# Patient Record
Sex: Female | Born: 1960 | Race: White | Hispanic: No | Marital: Married | Smoking: Current every day smoker
Health system: Southern US, Community
[De-identification: ages and names within clinical notes are randomized; demographics above are authoritative.]

## PROBLEM LIST (undated history)

## (undated) DIAGNOSIS — I471 Supraventricular tachycardia, unspecified: Secondary | ICD-10-CM

## (undated) DIAGNOSIS — R000 Tachycardia, unspecified: Secondary | ICD-10-CM

## (undated) DIAGNOSIS — I519 Heart disease, unspecified: Secondary | ICD-10-CM

## (undated) DIAGNOSIS — F32A Depression, unspecified: Secondary | ICD-10-CM

## (undated) DIAGNOSIS — I10 Essential (primary) hypertension: Secondary | ICD-10-CM

## (undated) DIAGNOSIS — F329 Major depressive disorder, single episode, unspecified: Secondary | ICD-10-CM

## (undated) DIAGNOSIS — F419 Anxiety disorder, unspecified: Secondary | ICD-10-CM

## (undated) DIAGNOSIS — N393 Stress incontinence (female) (male): Secondary | ICD-10-CM

## (undated) DIAGNOSIS — C569 Malignant neoplasm of unspecified ovary: Secondary | ICD-10-CM

## (undated) HISTORY — DX: Heart disease, unspecified: I51.9

## (undated) HISTORY — DX: Supraventricular tachycardia: I47.1

## (undated) HISTORY — DX: Supraventricular tachycardia, unspecified: I47.10

## (undated) HISTORY — DX: Stress incontinence (female) (male): N39.3

## (undated) HISTORY — DX: Essential (primary) hypertension: I10

## (undated) HISTORY — PX: MOUTH SURGERY: SHX715

## (undated) HISTORY — PX: FOOT SURGERY: SHX648

## (undated) HISTORY — PX: OVARY SURGERY: SHX727

## (undated) HISTORY — PX: OTHER SURGICAL HISTORY: SHX169

## (undated) HISTORY — PX: CHOLECYSTECTOMY: SHX55

## (undated) HISTORY — PX: ABDOMINAL HYSTERECTOMY: SHX81

---

## 2008-10-15 ENCOUNTER — Emergency Department (HOSPITAL_COMMUNITY): Admission: EM | Admit: 2008-10-15 | Discharge: 2008-10-15 | Payer: Self-pay | Admitting: Emergency Medicine

## 2009-06-18 ENCOUNTER — Emergency Department (HOSPITAL_COMMUNITY): Admission: EM | Admit: 2009-06-18 | Discharge: 2009-06-18 | Payer: Self-pay | Admitting: Emergency Medicine

## 2009-06-20 ENCOUNTER — Emergency Department (HOSPITAL_COMMUNITY): Admission: EM | Admit: 2009-06-20 | Discharge: 2009-06-20 | Payer: Self-pay | Admitting: Emergency Medicine

## 2009-10-25 ENCOUNTER — Emergency Department (HOSPITAL_COMMUNITY): Admission: EM | Admit: 2009-10-25 | Discharge: 2009-10-25 | Payer: Self-pay | Admitting: Emergency Medicine

## 2009-11-03 ENCOUNTER — Encounter: Admission: RE | Admit: 2009-11-03 | Discharge: 2009-11-03 | Payer: Self-pay | Admitting: Gastroenterology

## 2009-12-08 ENCOUNTER — Emergency Department (HOSPITAL_BASED_OUTPATIENT_CLINIC_OR_DEPARTMENT_OTHER): Admission: EM | Admit: 2009-12-08 | Discharge: 2009-12-08 | Payer: Self-pay | Admitting: Emergency Medicine

## 2009-12-08 ENCOUNTER — Ambulatory Visit: Payer: Self-pay | Admitting: Diagnostic Radiology

## 2009-12-30 ENCOUNTER — Encounter: Admission: RE | Admit: 2009-12-30 | Discharge: 2009-12-30 | Payer: Self-pay | Admitting: Gastroenterology

## 2010-01-20 ENCOUNTER — Ambulatory Visit (HOSPITAL_COMMUNITY): Admission: RE | Admit: 2010-01-20 | Discharge: 2010-01-20 | Payer: Self-pay | Admitting: Family Medicine

## 2010-02-03 ENCOUNTER — Emergency Department (HOSPITAL_COMMUNITY): Admission: EM | Admit: 2010-02-03 | Discharge: 2010-02-03 | Payer: Self-pay | Admitting: Emergency Medicine

## 2010-02-18 ENCOUNTER — Ambulatory Visit (HOSPITAL_COMMUNITY)
Admission: RE | Admit: 2010-02-18 | Discharge: 2010-02-18 | Payer: Self-pay | Source: Home / Self Care | Admitting: General Surgery

## 2010-06-02 ENCOUNTER — Emergency Department (HOSPITAL_BASED_OUTPATIENT_CLINIC_OR_DEPARTMENT_OTHER)
Admission: EM | Admit: 2010-06-02 | Discharge: 2010-06-02 | Payer: Self-pay | Source: Home / Self Care | Admitting: Emergency Medicine

## 2010-08-15 LAB — GLUCOSE, CAPILLARY: Glucose-Capillary: 143 mg/dL — ABNORMAL HIGH (ref 70–99)

## 2010-08-15 LAB — BASIC METABOLIC PANEL
BUN: 8 mg/dL (ref 6–23)
Chloride: 105 mEq/L (ref 96–112)
Creatinine, Ser: 0.8 mg/dL (ref 0.4–1.2)
GFR calc Af Amer: >60 mL/min (ref >60)
GFR calc non Af Amer: >60 mL/min (ref >60)
Glucose, Bld: 127 mg/dL — ABNORMAL HIGH (ref 70–99)

## 2010-08-15 LAB — CBC
HCT: 37.3 % (ref 36.0–46.0)
Platelets: 154 10*3/uL (ref 150–400)
RDW: 12.6 % (ref 11.5–15.5)
WBC: 5.2 10*3/uL (ref 4.0–10.5)

## 2010-08-15 LAB — URINALYSIS, ROUTINE W REFLEX MICROSCOPIC
Glucose, UA: NEGATIVE mg/dL
Hgb urine dipstick: NEGATIVE
Protein, ur: NEGATIVE mg/dL

## 2010-08-15 LAB — DIFFERENTIAL
Eosinophils Relative: 1 % (ref 0–5)
Lymphocytes Relative: 14 % (ref 12–46)
Lymphs Abs: 0.7 10*3/uL (ref 0.7–4.0)
Monocytes Absolute: 0.4 10*3/uL (ref 0.1–1.0)

## 2010-08-18 LAB — COMPREHENSIVE METABOLIC PANEL
ALT: 20 U/L (ref 0–35)
ALT: 29 U/L (ref 0–35)
AST: 20 U/L (ref 0–37)
AST: 25 U/L (ref 0–37)
Albumin: 4.4 g/dL (ref 3.5–5.2)
CO2: 28 mEq/L (ref 19–32)
Calcium: 9.2 mg/dL (ref 8.4–10.5)
Chloride: 105 mEq/L (ref 96–112)
Creatinine, Ser: 0.82 mg/dL (ref 0.4–1.2)
Glucose, Bld: 63 mg/dL — ABNORMAL LOW (ref 70–99)
Potassium: 3.9 mEq/L (ref 3.5–5.1)
Sodium: 140 mEq/L (ref 135–145)
Sodium: 141 mEq/L (ref 135–145)
Total Bilirubin: 0.7 mg/dL (ref 0.3–1.2)
Total Protein: 6.3 g/dL (ref 6.0–8.3)

## 2010-08-18 LAB — URINALYSIS, ROUTINE W REFLEX MICROSCOPIC
Glucose, UA: NEGATIVE mg/dL
Ketones, ur: NEGATIVE mg/dL
Leukocytes, UA: NEGATIVE
Nitrite: NEGATIVE
Protein, ur: NEGATIVE mg/dL
Urobilinogen, UA: 0.2 mg/dL (ref 0.0–1.0)

## 2010-08-18 LAB — CBC
HCT: 38.7 % (ref 36.0–46.0)
Hemoglobin: 14.2 g/dL (ref 12.0–15.0)
MCH: 29.3 pg (ref 26.0–34.0)
MCV: 84.3 fL (ref 78.0–100.0)
Platelets: 136 10*3/uL — ABNORMAL LOW (ref 150–400)
Platelets: 160 10*3/uL (ref 150–400)
RBC: 4.59 MIL/uL (ref 3.87–5.11)
RBC: 4.82 MIL/uL (ref 3.87–5.11)
RDW: 13.2 % (ref 11.5–15.5)
RDW: 13.3 % (ref 11.5–15.5)
WBC: 4.7 10*3/uL (ref 4.0–10.5)

## 2010-08-18 LAB — DIFFERENTIAL
Basophils Absolute: 0 10*3/uL (ref 0.0–0.1)
Basophils Relative: 1 % (ref 0–1)
Basophils Relative: 1 % (ref 0–1)
Eosinophils Absolute: 0 10*3/uL (ref 0.0–0.7)
Eosinophils Relative: 2 % (ref 0–5)
Lymphocytes Relative: 22 % (ref 12–46)
Monocytes Absolute: 0.4 10*3/uL (ref 0.1–1.0)
Monocytes Relative: 8 % (ref 3–12)
Neutro Abs: 3.2 10*3/uL (ref 1.7–7.7)
Neutro Abs: 3.6 10*3/uL (ref 1.7–7.7)

## 2010-08-18 LAB — APTT: aPTT: 30 seconds (ref 24–37)

## 2010-08-21 LAB — DIFFERENTIAL
Basophils Absolute: 0 10*3/uL (ref 0.0–0.1)
Basophils Absolute: 0 10*3/uL (ref 0.0–0.1)
Basophils Relative: 1 % (ref 0–1)
Basophils Relative: 0 % (ref 0–1)
Eosinophils Absolute: 0 10*3/uL (ref 0.0–0.7)
Eosinophils Absolute: 0.1 10*3/uL (ref 0.0–0.7)
Eosinophils Absolute: 0 10*3/uL (ref 0.0–0.7)
Lymphocytes Relative: 26 % (ref 12–46)
Lymphs Abs: 1.6 10*3/uL (ref 0.7–4.0)
Monocytes Absolute: 0.3 10*3/uL (ref 0.1–1.0)
Monocytes Relative: 6 % (ref 3–12)
Monocytes Relative: 9 % (ref 3–12)
Monocytes Relative: 7 % (ref 3–12)
Neutro Abs: 3.5 10*3/uL (ref 1.7–7.7)
Neutrophils Relative %: 63 % (ref 43–77)
Neutrophils Relative %: 72 % (ref 43–77)
Neutrophils Relative %: 77 % (ref 43–77)

## 2010-08-21 LAB — COMPREHENSIVE METABOLIC PANEL
ALT: 56 U/L — ABNORMAL HIGH (ref 0–35)
ALT: 11 U/L (ref 0–35)
Alkaline Phosphatase: 88 U/L (ref 39–117)
Alkaline Phosphatase: 78 U/L (ref 39–117)
BUN: 10 mg/dL (ref 6–23)
BUN: 10 mg/dL (ref 6–23)
CO2: 28 mEq/L (ref 19–32)
Chloride: 105 mEq/L (ref 96–112)
Chloride: 106 mEq/L (ref 96–112)
GFR calc non Af Amer: 59 mL/min — ABNORMAL LOW (ref >60)
Glucose, Bld: 112 mg/dL — ABNORMAL HIGH (ref 70–99)
Glucose, Bld: 103 mg/dL — ABNORMAL HIGH (ref 70–99)
Potassium: 3.8 mEq/L (ref 3.5–5.1)
Potassium: 3.2 mEq/L — ABNORMAL LOW (ref 3.5–5.1)
Sodium: 142 mEq/L (ref 135–145)
Sodium: 142 mEq/L (ref 135–145)
Total Bilirubin: 0.8 mg/dL (ref 0.3–1.2)
Total Bilirubin: 0.4 mg/dL (ref 0.3–1.2)
Total Protein: 7.2 g/dL (ref 6.0–8.3)

## 2010-08-21 LAB — POCT I-STAT, CHEM 8
BUN: 12 mg/dL (ref 6–23)
Chloride: 105 mEq/L (ref 96–112)
Creatinine, Ser: 0.9 mg/dL (ref 0.4–1.2)
Potassium: 3.5 mEq/L (ref 3.5–5.1)
Sodium: 139 mEq/L (ref 135–145)

## 2010-08-21 LAB — CK TOTAL AND CKMB (NOT AT ARMC): CK, MB: 2.9 ng/mL (ref 0.3–4.0)

## 2010-08-21 LAB — CBC
HCT: 40 % (ref 36.0–46.0)
HCT: 40.7 % (ref 36.0–46.0)
HCT: 39.2 % (ref 36.0–46.0)
Hemoglobin: 13.8 g/dL (ref 12.0–15.0)
Hemoglobin: 14 g/dL (ref 12.0–15.0)
Hemoglobin: 13.7 g/dL (ref 12.0–15.0)
MCV: 83.9 fL (ref 78.0–100.0)
MCV: 85.1 fL (ref 78.0–100.0)
Platelets: 187 10*3/uL (ref 150–400)
RBC: 4.85 MIL/uL (ref 3.87–5.11)
RBC: 4.56 MIL/uL (ref 3.87–5.11)
RDW: 12.6 % (ref 11.5–15.5)
WBC: 4.8 10*3/uL (ref 4.0–10.5)
WBC: 6.4 10*3/uL (ref 4.0–10.5)

## 2010-08-21 LAB — URINALYSIS, ROUTINE W REFLEX MICROSCOPIC
Bilirubin Urine: NEGATIVE
Glucose, UA: NEGATIVE mg/dL
Ketones, ur: NEGATIVE mg/dL
Protein, ur: NEGATIVE mg/dL
pH: 7.5 (ref 5.0–8.0)

## 2010-08-21 LAB — TSH: TSH: 1.798 u[IU]/mL (ref 0.350–4.500)

## 2010-08-21 LAB — TROPONIN I
Troponin I: 0.05 ng/mL (ref 0.00–0.06)
Troponin I: 0.07 ng/mL — ABNORMAL HIGH (ref 0.00–0.06)

## 2010-08-21 LAB — HEMOCCULT GUIAC POC 1CARD (OFFICE): Fecal Occult Bld: NEGATIVE

## 2010-08-21 LAB — LIPASE, BLOOD: Lipase: 29 U/L (ref 23–300)

## 2010-08-21 LAB — URINE MICROSCOPIC-ADD ON

## 2010-08-22 LAB — CBC
HCT: 42.3 % (ref 36.0–46.0)
Platelets: 170 10*3/uL (ref 150–400)
RDW: 13.3 % (ref 11.5–15.5)

## 2010-08-22 LAB — URINALYSIS, ROUTINE W REFLEX MICROSCOPIC
Glucose, UA: NEGATIVE mg/dL
Specific Gravity, Urine: 1.012 (ref 1.005–1.030)

## 2010-08-22 LAB — COMPREHENSIVE METABOLIC PANEL
Albumin: 4.4 g/dL (ref 3.5–5.2)
Alkaline Phosphatase: 87 U/L (ref 39–117)
BUN: 7 mg/dL (ref 6–23)
Creatinine, Ser: 0.71 mg/dL (ref 0.4–1.2)
GFR calc Af Amer: >60 mL/min (ref >60)
Potassium: 3.4 mEq/L — ABNORMAL LOW (ref 3.5–5.1)
Total Protein: 6.9 g/dL (ref 6.0–8.3)

## 2010-08-22 LAB — POCT CARDIAC MARKERS

## 2010-08-22 LAB — URINE MICROSCOPIC-ADD ON

## 2010-08-22 LAB — DIFFERENTIAL
Lymphocytes Relative: 14 % (ref 12–46)
Monocytes Absolute: 0.3 10*3/uL (ref 0.1–1.0)
Monocytes Relative: 5 % (ref 3–12)
Neutro Abs: 4.2 10*3/uL (ref 1.7–7.7)

## 2010-09-13 LAB — DIFFERENTIAL
Eosinophils Absolute: 0.1 10*3/uL (ref 0.0–0.7)
Eosinophils Relative: 2 % (ref 0–5)
Lymphocytes Relative: 27 % (ref 12–46)
Lymphs Abs: 1.1 10*3/uL (ref 0.7–4.0)
Monocytes Absolute: 0.3 10*3/uL (ref 0.1–1.0)
Monocytes Relative: 8 % (ref 3–12)

## 2010-09-13 LAB — BASIC METABOLIC PANEL
Chloride: 106 mEq/L (ref 96–112)
GFR calc non Af Amer: >60 mL/min (ref >60)
Potassium: 4 mEq/L (ref 3.5–5.1)
Sodium: 140 mEq/L (ref 135–145)

## 2010-09-13 LAB — POCT CARDIAC MARKERS: Troponin i, poc: <0.05 ng/mL (ref 0.00–0.09)

## 2010-09-13 LAB — CBC
HCT: 39.7 % (ref 36.0–46.0)
Hemoglobin: 14 g/dL (ref 12.0–15.0)
MCV: 83.8 fL (ref 78.0–100.0)
RBC: 4.74 MIL/uL (ref 3.87–5.11)
WBC: 4 10*3/uL (ref 4.0–10.5)

## 2011-03-17 ENCOUNTER — Other Ambulatory Visit: Payer: Self-pay

## 2011-03-17 ENCOUNTER — Encounter: Payer: Self-pay | Admitting: *Deleted

## 2011-03-17 ENCOUNTER — Emergency Department (INDEPENDENT_AMBULATORY_CARE_PROVIDER_SITE_OTHER): Payer: Self-pay

## 2011-03-17 ENCOUNTER — Emergency Department (HOSPITAL_BASED_OUTPATIENT_CLINIC_OR_DEPARTMENT_OTHER)
Admission: EM | Admit: 2011-03-17 | Discharge: 2011-03-17 | Disposition: A | Discharge status: Home / Self Care | Payer: Self-pay | Attending: Emergency Medicine | Admitting: Emergency Medicine

## 2011-03-17 DIAGNOSIS — F17200 Nicotine dependence, unspecified, uncomplicated: Secondary | ICD-10-CM | POA: Insufficient documentation

## 2011-03-17 DIAGNOSIS — R002 Palpitations: Secondary | ICD-10-CM | POA: Insufficient documentation

## 2011-03-17 DIAGNOSIS — Z87891 Personal history of nicotine dependence: Secondary | ICD-10-CM

## 2011-03-17 DIAGNOSIS — R42 Dizziness and giddiness: Secondary | ICD-10-CM | POA: Insufficient documentation

## 2011-03-17 HISTORY — DX: Anxiety disorder, unspecified: F41.9

## 2011-03-17 HISTORY — DX: Tachycardia, unspecified: R00.0

## 2011-03-17 HISTORY — DX: Malignant neoplasm of unspecified ovary: C56.9

## 2011-03-17 HISTORY — DX: Major depressive disorder, single episode, unspecified: F32.9

## 2011-03-17 HISTORY — DX: Depression, unspecified: F32.A

## 2011-03-17 LAB — CBC
HCT: 38.6 % (ref 36.0–46.0)
Hemoglobin: 13.6 g/dL (ref 12.0–15.0)
MCH: 28.6 pg (ref 26.0–34.0)
MCHC: 35.2 g/dL (ref 30.0–36.0)
MCV: 81.3 fL (ref 78.0–100.0)
Platelets: 147 10*3/uL — ABNORMAL LOW (ref 150–400)
RBC: 4.75 MIL/uL (ref 3.87–5.11)
RDW: 12.4 % (ref 11.5–15.5)
WBC: 4.9 10*3/uL (ref 4.0–10.5)

## 2011-03-17 LAB — BASIC METABOLIC PANEL
BUN: 12 mg/dL (ref 6–23)
CO2: 27 mEq/L (ref 19–32)
Calcium: 9.3 mg/dL (ref 8.4–10.5)
Chloride: 102 mEq/L (ref 96–112)
Creatinine, Ser: 0.8 mg/dL (ref 0.50–1.10)
GFR calc Af Amer: >90 mL/min (ref >90)
GFR calc non Af Amer: 85 mL/min — ABNORMAL LOW (ref >90)
Glucose, Bld: 128 mg/dL — ABNORMAL HIGH (ref 70–99)
Potassium: 3.7 mEq/L (ref 3.5–5.1)
Sodium: 138 mEq/L (ref 135–145)

## 2011-03-17 LAB — CARDIAC PANEL(CRET KIN+CKTOT+MB+TROPI)
CK, MB: 1.5 ng/mL (ref 0.3–4.0)
Relative Index: INVALID (ref 0.0–2.5)
Total CK: 42 U/L (ref 7–177)
Troponin I: <0.3 ng/mL (ref <0.30)

## 2011-03-17 LAB — TSH: TSH: 0.991 u[IU]/mL (ref 0.350–4.500)

## 2011-03-17 NOTE — ED Notes (Signed)
Patient states she has a two week history of light headedness associated with rapid heart rate and diaphoresis. Was seen by her PCP and started on Propranolol 2 x day as needed.  States her symptoms improved until 2 days ago when she developed URI and started taking OTC Coricidin.   This morning patient states she was awakened with a pounding headache.  States her bp was elevated and her heart was racing.  Took   Her propranolol with minimal relief.  Denies any rapid heart rate at this time.

## 2011-03-17 NOTE — ED Provider Notes (Signed)
History   50yf with palpitations. Onset a little over 2w ago. Feels like heart racing and sensation of dizziness/lightheadedness when this happens. Denies cp or sob, but sometimes feels sweaty. Saw pcp for same and recently srtarted on propanolol prn for same. Today pt woke up with ha and felt like heart racing again. Has been taking coricidin for uri symtpoms for past couple days. Currently no complaints. No fever or chills. No n/v. Denies hx of blood clot. No unusual swelling or leg pain. Denies hx of thyroid problems. deneis drug use.  CSN: [id number] Arrival date & time: 03/17/2011 11:43 AM  Chief Complaint  Patient presents with  . Dizziness    rapid heart    (Consider location/radiation/quality/duration/timing/severity/associated sxs/prior treatment) HPI  Past Medical History  Diagnosis Date  . Ovarian cancer   . Anxiety   . Rapid heart rate   . Migraine   . Depression     Past Surgical History  Procedure Date  . Ovary surgery   . Cholecystectomy   . Abdominal hysterectomy   . Mouth surgery     stone  . Bladder tack   . Foot surgery     No family history on file.  History  Substance Use Topics  . Smoking status: Current Some Day Smoker -- 1.0 packs/day for 5 years    Types: Cigarettes  . Smokeless tobacco: Not on file  . Alcohol Use: No    OB History    Grav Para Term Preterm Abortions TAB SAB Ect Mult Living                  Review of Systems   Review of symptoms negative unless otherwise noted in HPI.   Allergies  Iodine  Home Medications   Current Outpatient Rx  Name Route Sig Dispense Refill  . ASPIRIN 81 MG PO TABS Oral Take 81 mg by mouth daily.      . CHLORPHEN-PSEUDOEPHED-APAP 2-30-325 MG PO TABS Oral Take by mouth.      Marland Kitchen DIAZEPAM 5 MG PO TABS Oral Take 5 mg by mouth every 8 (eight) hours as needed.      . OMEGA-3 FATTY ACIDS 1000 MG PO CAPS Oral Take 2 g by mouth daily.      Marland Kitchen MIRTAZAPINE 30 MG PO TABS Oral Take 30 mg by mouth at  bedtime.      Marland Kitchen PRAVASTATIN SODIUM 20 MG PO TABS Oral Take by mouth daily.      Marland Kitchen PROPRANOLOL HCL 10 MG PO TABS Oral Take 10 mg by mouth 2 (two) times daily.      Marland Kitchen ZOLPIDEM TARTRATE 10 MG PO TABS Oral Take 10 mg by mouth at bedtime as needed.        BP 124/92  Pulse 91  Resp 22  SpO2 98%  Physical Exam  Nursing note and vitals reviewed. Constitutional: She appears well-developed and well-nourished.  HENT:  Head: Normocephalic and atraumatic.  Eyes: Pupils are equal, round, and reactive to light. Right eye exhibits no discharge. Left eye exhibits no discharge.  Neck: Neck supple.  Cardiovascular: Normal rate, regular rhythm and normal heart sounds.   Pulmonary/Chest: Effort normal and breath sounds normal. No respiratory distress.  Abdominal: Soft. She exhibits no distension. There is no tenderness.  Neurological: She is alert.  Skin: Skin is warm and dry.  Psychiatric: She has a normal mood and affect. Her behavior is normal. Thought content normal.    ED Course  Procedures (including critical care  time)  Labs Reviewed  BASIC METABOLIC PANEL - Abnormal; Notable for the following:    Glucose, Bld 128 (*)    GFR calc non Af Amer 85 (*)    All other components within normal limits  CBC - Abnormal; Notable for the following:    Platelets 147 (*)    All other components within normal limits  CARDIAC PANEL(CRET KIN+CKTOT+MB+TROPI)  TSH   Dg Chest 2 View  03/17/2011  *RADIOLOGY REPORT*  Clinical Data: Heart palpitations, smoking history  CHEST - 2 VIEW  Comparison: Chest x-ray of 05/23/2010  Findings: The lungs are clear and slightly hyperaerated.  Mild peribronchial thickening is noted.  Mediastinal contours are stable.  The heart is within normal limits in size.  No bony abnormality is seen  IMPRESSION: Hyperaeration.  Peribronchial thickening which may indicate bronchitis. No pneumonia.  Original Report Authenticated By: Juline Patch, M.D.   EKG:  Rhythm: normal  sinus Rate: 92 Intervals:normal ST segments: NS ST changes   No diagnosis found.    MDM  13YQ with palpitations. Sinus on ekg and on monitor in ED. W/u unremarkable. May be related to coricidan use?  Pt with hx of recurrent palpitations without clear etiology. Consider dysrhythmia and recommended that discuss with pcp possible monitoring. HD stable. Low clinical suspicion for PE. Not anemic. May have anxiety component. Pt is HD stable and feel appropriate for DC at this time.        Raeford Razor, MD 03/21/11 [phone]

## 2011-08-11 ENCOUNTER — Encounter (HOSPITAL_BASED_OUTPATIENT_CLINIC_OR_DEPARTMENT_OTHER): Payer: Self-pay | Admitting: *Deleted

## 2011-08-11 ENCOUNTER — Other Ambulatory Visit: Payer: Self-pay

## 2011-08-11 ENCOUNTER — Emergency Department (HOSPITAL_BASED_OUTPATIENT_CLINIC_OR_DEPARTMENT_OTHER)
Admission: EM | Admit: 2011-08-11 | Discharge: 2011-08-11 | Disposition: A | Discharge status: Home / Self Care | Payer: Self-pay | Attending: Emergency Medicine | Admitting: Emergency Medicine

## 2011-08-11 ENCOUNTER — Emergency Department (INDEPENDENT_AMBULATORY_CARE_PROVIDER_SITE_OTHER): Payer: Self-pay

## 2011-08-11 DIAGNOSIS — R079 Chest pain, unspecified: Secondary | ICD-10-CM | POA: Insufficient documentation

## 2011-08-11 DIAGNOSIS — R61 Generalized hyperhidrosis: Secondary | ICD-10-CM | POA: Insufficient documentation

## 2011-08-11 DIAGNOSIS — M25519 Pain in unspecified shoulder: Secondary | ICD-10-CM | POA: Insufficient documentation

## 2011-08-11 DIAGNOSIS — R110 Nausea: Secondary | ICD-10-CM | POA: Insufficient documentation

## 2011-08-11 DIAGNOSIS — E785 Hyperlipidemia, unspecified: Secondary | ICD-10-CM | POA: Insufficient documentation

## 2011-08-11 DIAGNOSIS — R0602 Shortness of breath: Secondary | ICD-10-CM

## 2011-08-11 DIAGNOSIS — F411 Generalized anxiety disorder: Secondary | ICD-10-CM | POA: Insufficient documentation

## 2011-08-11 DIAGNOSIS — F3289 Other specified depressive episodes: Secondary | ICD-10-CM | POA: Insufficient documentation

## 2011-08-11 DIAGNOSIS — R0989 Other specified symptoms and signs involving the circulatory and respiratory systems: Secondary | ICD-10-CM | POA: Insufficient documentation

## 2011-08-11 DIAGNOSIS — R0609 Other forms of dyspnea: Secondary | ICD-10-CM | POA: Insufficient documentation

## 2011-08-11 DIAGNOSIS — F329 Major depressive disorder, single episode, unspecified: Secondary | ICD-10-CM | POA: Insufficient documentation

## 2011-08-11 DIAGNOSIS — Z7982 Long term (current) use of aspirin: Secondary | ICD-10-CM | POA: Insufficient documentation

## 2011-08-11 DIAGNOSIS — K219 Gastro-esophageal reflux disease without esophagitis: Secondary | ICD-10-CM | POA: Insufficient documentation

## 2011-08-11 DIAGNOSIS — F17200 Nicotine dependence, unspecified, uncomplicated: Secondary | ICD-10-CM | POA: Insufficient documentation

## 2011-08-11 DIAGNOSIS — Z79899 Other long term (current) drug therapy: Secondary | ICD-10-CM | POA: Insufficient documentation

## 2011-08-11 DIAGNOSIS — Z8543 Personal history of malignant neoplasm of ovary: Secondary | ICD-10-CM | POA: Insufficient documentation

## 2011-08-11 DIAGNOSIS — Z9889 Other specified postprocedural states: Secondary | ICD-10-CM | POA: Insufficient documentation

## 2011-08-11 LAB — CBC
HCT: 37.2 % (ref 36.0–46.0)
Hemoglobin: 13 g/dL (ref 12.0–15.0)
MCV: 81 fL (ref 78.0–100.0)
RDW: 13.1 % (ref 11.5–15.5)
WBC: 5.1 10*3/uL (ref 4.0–10.5)

## 2011-08-11 LAB — COMPREHENSIVE METABOLIC PANEL
BUN: 8 mg/dL (ref 6–23)
CO2: 26 mEq/L (ref 19–32)
Calcium: 9.4 mg/dL (ref 8.4–10.5)
Chloride: 101 mEq/L (ref 96–112)
Creatinine, Ser: 0.8 mg/dL (ref 0.50–1.10)
GFR calc Af Amer: >90 mL/min (ref >90)
GFR calc non Af Amer: 85 mL/min — ABNORMAL LOW (ref >90)
Glucose, Bld: 112 mg/dL — ABNORMAL HIGH (ref 70–99)
Total Bilirubin: 0.2 mg/dL — ABNORMAL LOW (ref 0.3–1.2)

## 2011-08-11 LAB — DIFFERENTIAL
Basophils Absolute: 0 10*3/uL (ref 0.0–0.1)
Eosinophils Relative: 4 % (ref 0–5)
Lymphocytes Relative: 28 % (ref 12–46)
Lymphs Abs: 1.5 10*3/uL (ref 0.7–4.0)
Monocytes Absolute: 0.5 10*3/uL (ref 0.1–1.0)
Monocytes Relative: 9 % (ref 3–12)
Neutro Abs: 3 10*3/uL (ref 1.7–7.7)

## 2011-08-11 LAB — LIPASE, BLOOD: Lipase: 15 U/L (ref 11–59)

## 2011-08-11 LAB — TROPONIN I: Troponin I: <0.3 ng/mL (ref <0.30)

## 2011-08-11 MED ORDER — GI COCKTAIL ~~LOC~~
30.0000 mL | Freq: Once | ORAL | Status: AC
  Administered 2011-08-11: 30 mL via ORAL
  Filled 2011-08-11: qty 30

## 2011-08-11 MED ORDER — ASPIRIN 81 MG PO CHEW
CHEWABLE_TABLET | ORAL | Status: AC
  Filled 2011-08-11: qty 1

## 2011-08-11 MED ORDER — ASPIRIN 81 MG PO CHEW
324.0000 mg | CHEWABLE_TABLET | Freq: Once | ORAL | Status: AC
  Administered 2011-08-11: 324 mg via ORAL
  Filled 2011-08-11: qty 4

## 2011-08-11 MED ORDER — PANTOPRAZOLE SODIUM 40 MG IV SOLR
40.0000 mg | Freq: Once | INTRAVENOUS | Status: AC
  Administered 2011-08-11: 40 mg via INTRAVENOUS
  Filled 2011-08-11: qty 40

## 2011-08-11 MED ORDER — ESOMEPRAZOLE MAGNESIUM 40 MG PO CPDR: 40.0000 mg | DELAYED_RELEASE_CAPSULE | Freq: Every day | ORAL | Status: DC

## 2011-08-11 MED ORDER — SODIUM CHLORIDE 0.9 % IV SOLN
INTRAVENOUS | Status: DC
  Administered 2011-08-11: 18:00:00 via INTRAVENOUS

## 2011-08-11 NOTE — Discharge Instructions (Signed)
Gastroesophageal Reflux Disease, Adult Gastroesophageal reflux disease (GERD) happens when acid from your stomach flows up into the esophagus. When acid comes in contact with the esophagus, the acid causes soreness (inflammation) in the esophagus. Over time, GERD may create small holes (ulcers) in the lining of the esophagus. CAUSES   Increased body weight. This puts pressure on the stomach, making acid rise from the stomach into the esophagus.   Smoking. This increases acid production in the stomach.   Drinking alcohol. This causes decreased pressure in the lower esophageal sphincter (valve or ring of muscle between the esophagus and stomach), allowing acid from the stomach into the esophagus.   Late evening meals and a full stomach. This increases pressure and acid production in the stomach.   A malformed lower esophageal sphincter.  Sometimes, no cause is found. SYMPTOMS   Burning pain in the lower part of the mid-chest behind the breastbone and in the mid-stomach area. This may occur twice a week or more often.   Trouble swallowing.   Sore throat.   Dry cough.   Asthma-like symptoms including chest tightness, shortness of breath, or wheezing.  DIAGNOSIS  Your caregiver may be able to diagnose GERD based on your symptoms. In some cases, X-rays and other tests may be done to check for complications or to check the condition of your stomach and esophagus. TREATMENT  Your caregiver may recommend over-the-counter or prescription medicines to help decrease acid production. Ask your caregiver before starting or adding any new medicines.  HOME CARE INSTRUCTIONS   Change the factors that you can control. Ask your caregiver for guidance concerning weight loss, quitting smoking, and alcohol consumption.   Avoid foods and drinks that make your symptoms worse, such as:   Caffeine or alcoholic drinks.   Chocolate.   Peppermint or mint flavorings.   Garlic and onions.   Spicy foods.     Citrus fruits, such as oranges, lemons, or limes.   Tomato-based foods such as sauce, chili, salsa, and pizza.   Fried and fatty foods.   Avoid lying down for the 3 hours prior to your bedtime or prior to taking a nap.   Eat small, frequent meals instead of large meals.   Wear loose-fitting clothing. Do not wear anything tight around your waist that causes pressure on your stomach.   Raise the head of your bed 6 to 8 inches with wood blocks to help you sleep. Extra pillows will not help.   Only take over-the-counter or prescription medicines for pain, discomfort, or fever as directed by your caregiver.   Do not take aspirin, ibuprofen, or other nonsteroidal anti-inflammatory drugs (NSAIDs).  SEEK IMMEDIATE MEDICAL CARE IF:   You have pain in your arms, neck, jaw, teeth, or back.   Your pain increases or changes in intensity or duration.   You develop nausea, vomiting, or sweating (diaphoresis).   You develop shortness of breath, or you faint.   Your vomit is green, yellow, black, or looks like coffee grounds or blood.   Your stool is red, bloody, or black.  These symptoms could be signs of other problems, such as heart disease, gastric bleeding, or esophageal bleeding. MAKE SURE YOU:   Understand these instructions.   Will watch your condition.   Will get help right away if you are not doing well or get worse.  Document Released: 03/01/2005 Document Revised: 05/11/2011 Document Reviewed: 12/09/2010 [facility] Patient Information 2012 [place], [place].  Esomeprazole capsules What is this medicine? ESOMEPRAZOLE (es  oh ME pray zol) prevents the production of acid in the stomach. It is used to treat gastroesophageal reflux disease (GERD), ulcers, certain bacteria in the stomach, and inflammation of the esophagus. It can also be used to prevent ulcers in patients taking medicines called NSAIDs. This medicine may be used for other purposes; ask your health care provider or  pharmacist if you have questions. What should I tell my health care provider before I take this medicine? They need to know if you have any of these conditions: -liver disease -low levels of magnesium in the blood -an unusual or allergic reaction to esomeprazole, other medicines, foods, dyes, or preservatives -pregnant or trying to get pregnant -breast-feeding How should I use this medicine? Take this medicine by mouth. Swallow the capsules whole with a drink of water. Follow the directions on the prescription label. Do not crush, break or chew. The capsules can be opened and the contents sprinkled on applesauce. Do not crush the contents into the food. This medicine works best if taken on an empty stomach at least one hour before a meal. Take your medicine at regular intervals. Do not take your medicine more often than directed. Talk to your pediatrician regarding the use of this medicine in children. Special care may be needed. Overdosage: If you think you have taken too much of this medicine contact a poison control center or emergency room at once. NOTE: This medicine is only for you. Do not share this medicine with others. What if I miss a dose? If you miss a dose, take it as soon as you can. If it is almost time for your next dose, take only that dose. Do not take double or extra doses. What may interact with this medicine? Do not take this medicine with any of the following medications: -atazanavir -nelfinavir This medicine may also interact with the following medications: -ampicillin -digoxin -diuretics -iron salts -itraconazole -ketoconazole -warfarin This list may not describe all possible interactions. Give your health care provider a list of all the medicines, herbs, non-prescription drugs, or dietary supplements you use. Also tell them if you smoke, drink alcohol, or use illegal drugs. Some items may interact with your medicine. What should I watch for while using this  medicine? It can take several days before your stomach pains get better. Check with your doctor or health care professional if your condition does not start to get better, or if it gets worse. You may need blood work done while you are taking this medicine. What side effects may I notice from receiving this medicine? Side effects that you should report to your doctor or health care professional as soon as possible: -allergic reactions like skin rash, itching or hives, swelling of the face, lips, or tongue -bone, muscle or joint pain -breathing problems -chest pain or chest tightness -dark yellow or brown urine -fast, irregular heartbeat -feeling faint or lightheaded -fever or sore throat -muscle spasms -tremors -unusual bleeding or bruising -unusually weak or tired -upset stomach -yellowing of the eyes or skin Side effects that usually do not require medical attention (Report these to your doctor or health care professional if they continue or are bothersome.): -constipation -diarrhea -dry mouth -headache -nausea -stomach pain or gas -vomiting This list may not describe all possible side effects. Call your doctor for medical advice about side effects. You may report side effects to FDA at 1-800-FDA-1088. Where should I keep my medicine? Keep out of the reach of children. Store at room temperature between  15 and 30 degrees C (59 and 86 degrees F). Protect from light and moisture. Throw away any unused medicine after the expiration date. NOTE: This sheet is a summary. It may not cover all possible information. If you have questions about this medicine, talk to your doctor, pharmacist, or health care provider.  2012, [employer]. (08/10/2009 10:44:28 AM)

## 2011-08-11 NOTE — ED Notes (Signed)
Pt to triage in w/c, reports chest pain x yesterday, intermittant, sharp in nature, with sob and nausea.

## 2011-08-11 NOTE — ED Provider Notes (Signed)
History     CSN: [id number]  Arrival date & time 08/11/11  1726   First MD Initiated Contact with Patient 08/11/11 1748      Chief Complaint  Patient presents with  . Chest Pain    (Consider location/radiation/quality/duration/timing/severity/associated sxs/prior treatment) Patient is a 51 y.o. female presenting with chest pain. The history is provided by the patient.  Chest Pain   Yesterday, she had a dull, achy pain in her right shoulder. Nothing made the pain better nothing made it worse. About one hour ago, she started having a sharp pain in the epigastric area which radiated around to the back and a little bit to the right chest. There is associated nausea, dyspnea and diaphoresis. She tried taking some Mylanta which to give partial relief. Pain is very similar to what she had prior to having her gallbladder taken out. Pain was better when she stood up, worse when she sat up. She did not notice anything else affecting the pain. Pain was 10 out of 10 at its worst, 6/10 currently. Cardiac risk factors that include tobacco use and hyperlipidemia. No history of hypertension, diabetes, and no family history of premature coronary atherosclerosis.  Past Medical History  Diagnosis Date  . Ovarian cancer   . Anxiety   . Rapid heart rate   . Migraine   . Depression     Past Surgical History  Procedure Date  . Ovary surgery   . Cholecystectomy   . Abdominal hysterectomy   . Mouth surgery     stone  . Bladder tack   . Foot surgery     History reviewed. No pertinent family history.  History  Substance Use Topics  . Smoking status: Current Some Day Smoker -- 1.0 packs/day for 5 years    Types: Cigarettes  . Smokeless tobacco: Not on file  . Alcohol Use: No    OB History    Grav Para Term Preterm Abortions TAB SAB Ect Mult Living                  Review of Systems  Cardiovascular: Positive for chest pain.  All other systems reviewed and are negative.    Allergies    Iodine  Home Medications   Current Outpatient Rx  Name Route Sig Dispense Refill  . ASPIRIN 81 MG PO TABS Oral Take 81 mg by mouth daily.      . CHLORPHEN-PSEUDOEPHED-APAP 2-30-325 MG PO TABS Oral Take by mouth.      Marland Kitchen DIAZEPAM 5 MG PO TABS Oral Take 5 mg by mouth every 8 (eight) hours as needed.      . OMEGA-3 FATTY ACIDS 1000 MG PO CAPS Oral Take 2 g by mouth daily.      Marland Kitchen MIRTAZAPINE 30 MG PO TABS Oral Take 30 mg by mouth at bedtime.      Marland Kitchen PRAVASTATIN SODIUM 20 MG PO TABS Oral Take by mouth daily.      Marland Kitchen PROPRANOLOL HCL 10 MG PO TABS Oral Take 10 mg by mouth 2 (two) times daily.      Marland Kitchen ZOLPIDEM TARTRATE 10 MG PO TABS Oral Take 10 mg by mouth at bedtime as needed.        BP 117/77  Pulse 95  Temp(Src) 98.2 F (36.8 C) (Oral)  Resp 20  Ht 5\' 2"  (1.575 m)  Wt 138 lb (62.596 kg)  BMI 25.24 kg/m2  SpO2 97%  Physical Exam  Nursing note and vitals reviewed.  51 year old female  is resting comfortably and in no acute distress. Vital signs are normal. Oxygen saturation is 97% which is normal. Head is normocephalic and atraumatic. PERRLA, EOMI. Oropharynx is clear. Neck is nontender and supple without adenopathy JVD. Lungs are clear without rales, wheezes, rhonchi. Heart has regular rate rhythm without murmur. There is no chest wall tenderness. Abdomen is soft, flat, nontender without masses or splenomegaly. There is mild epigastric tenderness. There is no right upper quadrant tenderness. There is no rebound or guarding. Extremities no cyanosis or edema, full range of motion is present. Skin is warm and dry without rash. Neurologic: Mental status is normal, cranial nerves are intact, there no focal motor or sensory deficits.  ED Course  Procedures (including critical care time)  Results for orders placed during the hospital encounter of 08/11/11  CBC      Component Value Range   WBC 5.1  4.0 - 10.5 (K/uL)   RBC 4.59  3.87 - 5.11 (MIL/uL)   Hemoglobin 13.0  12.0 - 15.0 (g/dL)   HCT  16.1  09.6 - 04.5 (%)   MCV 81.0  78.0 - 100.0 (fL)   MCH 28.3  26.0 - 34.0 (pg)   MCHC 34.9  30.0 - 36.0 (g/dL)   RDW 40.9  81.1 - 91.4 (%)   Platelets 178  150 - 400 (K/uL)  DIFFERENTIAL      Component Value Range   Neutrophils Relative 58  43 - 77 (%)   Neutro Abs 3.0  1.7 - 7.7 (K/uL)   Lymphocytes Relative 28  12 - 46 (%)   Lymphs Abs 1.5  0.7 - 4.0 (K/uL)   Monocytes Relative 9  3 - 12 (%)   Monocytes Absolute 0.5  0.1 - 1.0 (K/uL)   Eosinophils Relative 4  0 - 5 (%)   Eosinophils Absolute 0.2  0.0 - 0.7 (K/uL)   Basophils Relative 1  0 - 1 (%)   Basophils Absolute 0.0  0.0 - 0.1 (K/uL)  COMPREHENSIVE METABOLIC PANEL      Component Value Range   Sodium 137  135 - 145 (mEq/L)   Potassium 3.7  3.5 - 5.1 (mEq/L)   Chloride 101  96 - 112 (mEq/L)   CO2 26  19 - 32 (mEq/L)   Glucose, Bld 112 (*) 70 - 99 (mg/dL)   BUN 8  6 - 23 (mg/dL)   Creatinine, Ser 7.82  0.50 - 1.10 (mg/dL)   Calcium 9.4  8.4 - 95.6 (mg/dL)   Total Protein 6.9  6.0 - 8.3 (g/dL)   Albumin 4.1  3.5 - 5.2 (g/dL)   AST 16  0 - 37 (U/L)   ALT 17  0 - 35 (U/L)   Alkaline Phosphatase 90  39 - 117 (U/L)   Total Bilirubin 0.2 (*) 0.3 - 1.2 (mg/dL)   GFR calc non Af Amer 85 (*) >90 (mL/min)   GFR calc Af Amer >90  >90 (mL/min)  LIPASE, BLOOD      Component Value Range   Lipase 15  11 - 59 (U/L)  TROPONIN I      Component Value Range   Troponin I <0.30  <0.30 (ng/mL)  TROPONIN I      Component Value Range   Troponin I <0.30  <0.30 (ng/mL)   Dg Chest Portable 1 View  08/11/2011  *RADIOLOGY REPORT*  Clinical Data: 51 year old female with chest pain and shortness of breath  PORTABLE CHEST - 1 VIEW  Comparison: 03/17/2011.  Findings: The cardiomediastinal silhouette  is unremarkable. Mild peribronchial thickening is unchanged. There is no evidence of focal airspace disease, pulmonary edema, suspicious pulmonary nodule/mass, pleural effusion, or pneumothorax. No acute bony abnormalities are identified.  IMPRESSION:  No evidence of acute cardiopulmonary disease.  Original Report Authenticated By: Rosendo Gros, M.D.     ECG shows normal sinus rhythm with a rate of 88, no ectopy. Normal axis. Normal P wave. Normal QRS. Normal intervals. Normal ST and T waves. Impression: normal ECG. No old ECG available for comparison.  She got complete relief of pain with the GI cocktail. She was given a dose of protonic intravenously. She continues to be pain-free in the emergency department. 2 cardiac markers are negative. Pain appears to be due to gastroesophageal reflux disease and she will be sent home with a prescription for Nexium.  1. GERD (gastroesophageal reflux disease)       MDM  Epigastric pain which is most likely GI in origin. However, cardiac evaluation will be done. She'll be given a trial of a GI cocktail and proton ask        Dione Booze, MD 08/11/11 2210

## 2011-08-30 ENCOUNTER — Emergency Department (INDEPENDENT_AMBULATORY_CARE_PROVIDER_SITE_OTHER): Payer: Self-pay

## 2011-08-30 ENCOUNTER — Encounter (HOSPITAL_BASED_OUTPATIENT_CLINIC_OR_DEPARTMENT_OTHER): Payer: Self-pay | Admitting: Emergency Medicine

## 2011-08-30 ENCOUNTER — Emergency Department (HOSPITAL_BASED_OUTPATIENT_CLINIC_OR_DEPARTMENT_OTHER)
Admission: EM | Admit: 2011-08-30 | Discharge: 2011-08-30 | Disposition: A | Discharge status: Home / Self Care | Payer: Self-pay | Attending: Emergency Medicine | Admitting: Emergency Medicine

## 2011-08-30 DIAGNOSIS — Z7982 Long term (current) use of aspirin: Secondary | ICD-10-CM | POA: Insufficient documentation

## 2011-08-30 DIAGNOSIS — R05 Cough: Secondary | ICD-10-CM | POA: Insufficient documentation

## 2011-08-30 DIAGNOSIS — J40 Bronchitis, not specified as acute or chronic: Secondary | ICD-10-CM | POA: Insufficient documentation

## 2011-08-30 DIAGNOSIS — F341 Dysthymic disorder: Secondary | ICD-10-CM | POA: Insufficient documentation

## 2011-08-30 DIAGNOSIS — Z79899 Other long term (current) drug therapy: Secondary | ICD-10-CM | POA: Insufficient documentation

## 2011-08-30 DIAGNOSIS — J45909 Unspecified asthma, uncomplicated: Secondary | ICD-10-CM | POA: Insufficient documentation

## 2011-08-30 DIAGNOSIS — R059 Cough, unspecified: Secondary | ICD-10-CM | POA: Insufficient documentation

## 2011-08-30 DIAGNOSIS — R0602 Shortness of breath: Secondary | ICD-10-CM | POA: Insufficient documentation

## 2011-08-30 MED ORDER — IPRATROPIUM BROMIDE 0.02 % IN SOLN
RESPIRATORY_TRACT | Status: AC
  Administered 2011-08-30: 0.5 mg via RESPIRATORY_TRACT
  Filled 2011-08-30: qty 2.5

## 2011-08-30 MED ORDER — ALBUTEROL SULFATE (5 MG/ML) 0.5% IN NEBU
5.0000 mg | INHALATION_SOLUTION | Freq: Once | RESPIRATORY_TRACT | Status: AC
  Administered 2011-08-30: 5 mg via RESPIRATORY_TRACT

## 2011-08-30 MED ORDER — IPRATROPIUM BROMIDE 0.02 % IN SOLN
0.5000 mg | Freq: Once | RESPIRATORY_TRACT | Status: AC
  Administered 2011-08-30: 0.5 mg via RESPIRATORY_TRACT

## 2011-08-30 MED ORDER — ALBUTEROL SULFATE (5 MG/ML) 0.5% IN NEBU
INHALATION_SOLUTION | RESPIRATORY_TRACT | Status: AC
  Administered 2011-08-30: 5 mg via RESPIRATORY_TRACT
  Filled 2011-08-30: qty 1

## 2011-08-30 NOTE — ED Notes (Signed)
Pt was in [facility] and was diagnosed with bronchitis and given steroids and an Albuterol HFA MDI to use. Pt states that she is finished with her RX and the Albuterol makes her heart rate increase so she doesn't take but one puff only one time a day. Pt states that she just feels SHOB.

## 2011-08-30 NOTE — Discharge Instructions (Signed)
Albuterol inhalation aerosol What is this medicine? ALBUTEROL (al Gaspar Bidding) is a bronchodilator. It helps open up the airways in your lungs to make it easier to breathe. This medicine is used to treat and to prevent bronchospasm. This medicine may be used for other purposes; ask your health care provider or pharmacist if you have questions. What should I tell my health care provider before I take this medicine? They need to know if you have any of the following conditions: -diabetes -heart disease or irregular heartbeat -high blood pressure  Smoking Cessation This document explains the best ways for you to quit smoking and new treatments to help. It lists new medicines that can double or triple your chances of quitting and quitting for good. It also considers ways to avoid relapses and concerns you may have about quitting, including weight gain. NICOTINE: A POWERFUL ADDICTION If you have tried to quit smoking, you know how hard it can be. It is hard because nicotine is a very addictive drug. For some people, it can be as addictive as heroin or cocaine. Usually, people make 2 or 3 tries, or more, before finally being able to quit. Each time you try to quit, you can learn about what helps and what hurts. Quitting takes hard work and a lot of effort, but you can quit smoking. QUITTING SMOKING IS ONE OF THE MOST IMPORTANT THINGS YOU WILL EVER DO.  You will live longer, feel better, and live better.   The impact on your body of quitting smoking is felt almost immediately:   Within 20 minutes, blood pressure decreases. Pulse returns to its normal level.   After 8 hours, carbon monoxide levels in the blood return to normal. Oxygen level increases.   After 24 hours, chance of heart attack starts to decrease. Breath, hair, and body stop smelling like smoke.   After 48 hours, damaged nerve endings begin to recover. Sense of taste and smell improve.   After 72 hours, the body is virtually free  of nicotine. Bronchial tubes relax and breathing becomes easier.   After 2 to 12 weeks, lungs can hold more air. Exercise becomes easier and circulation improves.   Quitting will reduce your risk of having a heart attack, stroke, cancer, or lung disease:   After 1 year, the risk of coronary heart disease is cut in half.   After 5 years, the risk of stroke falls to the same as a nonsmoker.   After 10 years, the risk of lung cancer is cut in half and the risk of other cancers decreases significantly.   After 15 years, the risk of coronary heart disease drops, usually to the level of a nonsmoker.   If you are pregnant, quitting smoking will improve your chances of having a healthy baby.   The people you live with, especially your children, will be healthier.   You will have extra money to spend on things other than cigarettes.  FIVE KEYS TO QUITTING Studies have shown that these 5 steps will help you quit smoking and quit for good. You have the best chances of quitting if you use them together: 1. Get ready.  2. Get support and encouragement.  3. Learn new skills and behaviors.  4. Get medicine to reduce your nicotine addiction and use it correctly.  5. Be prepared for relapse or difficult situations. Be determined to continue trying to quit, even if you do not succeed at first.  1. GET READY  Set a quit  date.   Change your environment.   Get rid of ALL cigarettes, ashtrays, matches, and lighters in your home, car, and place of work.   Do not let people smoke in your home.   Review your past attempts to quit. Think about what worked and what did not.   Once you quit, do not smoke. NOT EVEN A PUFF!  2. GET SUPPORT AND ENCOURAGEMENT Studies have shown that you have a better chance of being successful if you have help. You can get support in many ways.  Tell your family, friends, and coworkers that you are going to quit and need their support. Ask them not to smoke around you.    Talk to your caregivers (doctor, dentist, nurse, pharmacist, psychologist, and/or smoking counselor).   Get individual, group, or telephone counseling and support. The more counseling you have, the better your chances are of quitting. Programs are available at [employer] and health centers. Call your local health department for information about programs in your area.   Spiritual beliefs and practices may help some smokers quit.   Quit meters are Photographer that keep track of quit statistics, such as amount of "quit-time," cigarettes not smoked, and money saved.   Many smokers find one or more of the many self-help books available useful in helping them quit and stay off tobacco.  3. LEARN NEW SKILLS AND BEHAVIORS  Try to distract yourself from urges to smoke. Talk to someone, go for a walk, or occupy your time with a task.   When you first try to quit, change your routine. Take a different route to work. Drink tea instead of coffee. Eat breakfast in a different place.   Do something to reduce your stress. Take a hot bath, exercise, or read a book.   Plan something enjoyable to do every day. Reward yourself for not smoking.   Explore interactive web-based programs that specialize in helping you quit.  4. GET MEDICINE AND USE IT CORRECTLY Medicines can help you stop smoking and decrease the urge to smoke. Combining medicine with the above behavioral methods and support can quadruple your chances of successfully quitting smoking. The U[place] Food and Drug Administration (FDA) has approved 7 medicines to help you quit smoking. These medicines fall into 3 categories.  Nicotine replacement therapy (delivers nicotine to your body without the negative effects and risks of smoking):   Nicotine gum: Available over-the-counter.   Nicotine lozenges: Available over-the-counter.   Nicotine inhaler: Available by prescription.   Nicotine nasal spray:  Available by prescription.   Nicotine skin patches (transdermal): Available by prescription and over-the-counter.   Antidepressant medicine (helps people abstain from smoking, but how this works is unknown):   Bupropion sustained-release (SR) tablets: Available by prescription.   Nicotinic receptor partial agonist (simulates the effect of nicotine in your brain):   Varenicline tartrate tablets: Available by prescription.   Ask your caregiver for advice about which medicines to use and how to use them. Carefully read the information on the package.   Everyone who is trying to quit may benefit from using a medicine. If you are pregnant or trying to become pregnant, nursing an infant, you are under age 62, or you smoke fewer than 10 cigarettes per day, talk to your caregiver before taking any nicotine replacement medicines.   You should stop using a nicotine replacement product and call your caregiver if you experience nausea, dizziness, weakness, vomiting, fast or irregular heartbeat, mouth problems with  the lozenge or gum, or redness or swelling of the skin around the patch that does not go away.   Do not use any other product containing nicotine while using a nicotine replacement product.   Talk to your caregiver before using these products if you have diabetes, heart disease, asthma, stomach ulcers, you had a recent heart attack, you have high blood pressure that is not controlled with medicine, a history of irregular heartbeat, or you have been prescribed medicine to help you quit smoking.  5. BE PREPARED FOR RELAPSE OR DIFFICULT SITUATIONS  Most relapses occur within the first 3 months after quitting. Do not be discouraged if you start smoking again. Remember, most people try several times before they finally quit.   You may have symptoms of withdrawal because your body is used to nicotine. You may crave cigarettes, be irritable, feel very hungry, cough often, get headaches, or have  difficulty concentrating.   The withdrawal symptoms are only temporary. They are strongest when you first quit, but they will go away within 10 to 14 days.  Here are some difficult situations to watch for:  Alcohol. Avoid drinking alcohol. Drinking lowers your chances of successfully quitting.   Caffeine. Try to reduce the amount of caffeine you consume. It also lowers your chances of successfully quitting.   Other smokers. Being around smoking can make you want to smoke. Avoid smokers.   Weight gain. Many smokers will gain weight when they quit, usually less than 10 pounds. Eat a healthy diet and stay active. Do not let weight gain distract you from your main goal, quitting smoking. Some medicines that help you quit smoking may also help delay weight gain. You can always lose the weight gained after you quit.   Bad mood or depression. There are a lot of ways to improve your mood other than smoking.  If you are having problems with any of these situations, talk to your caregiver. SPECIAL SITUATIONS AND CONDITIONS Studies suggest that everyone can quit smoking. Your situation or condition can give you a special reason to quit.  Pregnant women/new mothers: By quitting, you protect your baby's health and your own.   Hospitalized patients: By quitting, you reduce health problems and help healing.   Heart attack patients: By quitting, you reduce your risk of a second heart attack.   Lung, head, and neck cancer patients: By quitting, you reduce your chance of a second cancer.   Parents of children and adolescents: By quitting, you protect your children from illnesses caused by secondhand smoke.  QUESTIONS TO THINK ABOUT Think about the following questions before you try to stop smoking. You may want to talk about your answers with your caregiver.  Why do you want to quit?   If you tried to quit in the past, what helped and what did not?   What will be the most difficult situations for you  after you quit? How will you plan to handle them?   Who can help you through the tough times? Your family? Friends? Caregiver?   What pleasures do you get from smoking? What ways can you still get pleasure if you quit?  Here are some questions to ask your caregiver:  How can you help me to be successful at quitting?   What medicine do you think would be best for me and how should I take it?   What should I do if I need more help?   What is smoking withdrawal like? How can  I get information on withdrawal?  Quitting takes hard work and a lot of effort, but you can quit smoking. FOR MORE INFORMATION  Smokefree.gov (http://www.davis-sullivan.com/) provides free, accurate, evidence-based information and professional assistance to help support the immediate and long-term needs of people trying to quit smoking. Document Released: 05/16/2001 Document Revised: 05/11/2011 Document Reviewed: 03/08/2009 [facility] Patient Information 2012 [place], [place].Chronic Obstructive Pulmonary Disease Chronic obstructive pulmonary disease (COPD) is a condition in which airflow from the lungs is restricted. The lungs can never return to normal, but there are measures you can take which will improve them and make you feel better. CAUSES   Smoking.   Exposure to secondhand smoke.   Breathing in irritants (pollution, cigarette smoke, strong smells, aerosol sprays, paint fumes).   History of lung infections.  TREATMENT  Treatment focuses on making you comfortable (supportive care). Your caregiver may prescribe medications (inhaled or pills) to help improve your breathing. HOME CARE INSTRUCTIONS   If you smoke, stop smoking.   Avoid exposure to smoke, chemicals, and fumes that aggravate your breathing.   Take antibiotic medicines as directed by your caregiver.   Avoid medicines that dry up your system and slow down the elimination of secretions (antihistamines and cough syrups). This decreases respiratory  capacity and may lead to infections.   Drink enough water and fluids to keep your urine clear or pale yellow. This loosens secretions.   Use humidifiers at home and at your bedside if they do not make breathing difficult.   Receive all protective vaccines your caregiver suggests, especially pneumococcal and influenza.   Use home oxygen as suggested.   Stay active. Exercise and physical activity will help maintain your ability to do things you want to do.   Eat a healthy diet.  SEEK MEDICAL CARE IF:   You develop pus-like mucus (sputum).   Breathing is more labored or exercise becomes difficult to do.   You are running out of the medicine you take for your breathing.  SEEK IMMEDIATE MEDICAL CARE IF:   You have a rapid heart rate.   You have agitation, confusion, tremors, or are in a stupor (family members may need to observe this).   It becomes difficult to breathe.   You develop chest pain.   You have a fever.  MAKE SURE YOU:   Understand these instructions.   Will watch your condition.   Will get help right away if you are not doing well or get worse.  Document Released: 03/01/2005 Document Revised: 05/11/2011 Document Reviewed: 07/22/2010 [employer] Patient Information 2012 [place], [place]. -pheochromocytoma -seizures -thyroid disease -an unusual or allergic reaction to albuterol, levalbuterol, sulfites, other medicines, foods, dyes, or preservatives -pregnant or trying to get pregnant -breast-feeding How should I use this medicine? This medicine is for inhalation through the mouth. Follow the directions on your prescription label. Take your medicine at regular intervals. Do not use more often than directed. Make sure that you are using your inhaler correctly. Ask you doctor or health care provider if you have any questions. Use this medicine before you use any other inhaler. Wait 5 minutes or more before between using different inhalers. Talk to your pediatrician  regarding the use of this medicine in children. Special care may be needed. Overdosage: If you think you have taken too much of this medicine contact a poison control center or emergency room at once. NOTE: This medicine is only for you. Do not share this medicine with others. What if I miss a dose?  If you miss a dose, use it as soon as you can. If it is almost time for your next dose, use only that dose. Do not use double or extra doses. What may interact with this medicine? -anti-infectives like chloroquine and pentamidine -caffeine -cisapride -diuretics -medicines for colds -medicines for depression or for emotional or psychotic conditions -medicines for weight loss including some herbal products -methadone -some antibiotics like clarithromycin, erythromycin, levofloxacin, and linezolid -some heart medicines -steroid hormones like dexamethasone, cortisone, hydrocortisone -theophylline -thyroid hormones This list may not describe all possible interactions. Give your health care provider a list of all the medicines, herbs, non-prescription drugs, or dietary supplements you use. Also tell them if you smoke, drink alcohol, or use illegal drugs. Some items may interact with your medicine. What should I watch for while using this medicine? Tell your doctor or health care professional if your symptoms do not improve. Do not use extra albuterol. If your asthma or bronchitis gets worse while you are using this medicine, call your doctor right away. If your mouth gets dry try chewing sugarless gum or sucking hard candy. Drink water as directed. What side effects may I notice from receiving this medicine? Side effects that you should report to your doctor or health care professional as soon as possible: -allergic reactions like skin rash, itching or hives, swelling of the face, lips, or tongue -breathing problems -chest pain -feeling faint or lightheaded, falls -high blood pressure -irregular  heartbeat -fever -muscle cramps or weakness -pain, tingling, numbness in the hands or feet -vomiting Side effects that usually do not require medical attention (report to your doctor or health care professional if they continue or are bothersome): -cough -difficulty sleeping -headache -nervousness or trembling -stomach upset -stuffy or runny nose -throat irritation -unusual taste This list may not describe all possible side effects. Call your doctor for medical advice about side effects. You may report side effects to FDA at 1-800-FDA-1088. Where should I keep my medicine? Keep out of the reach of children. Store at room temperature between 15 and 30 degrees C (59 and 86 degrees F). The contents are under pressure and may burst when exposed to heat or flame. Do not freeze. This medicine does not work as well if it is too cold. Throw away any unused medicine after the expiration date. Inhalers need to be thrown away after the labeled number of puffs have been used or by the expiration date; whichever comes first. Ventolin HFA should be thrown away 12 months after removing from foil pouch. Check the instructions that come with your medicine. NOTE: This sheet is a summary. It may not cover all possible information. If you have questions about this medicine, talk to your doctor, pharmacist, or health care provider.  2012, [employer]. (10/07/2010 11:00:52 AM)

## 2011-08-30 NOTE — ED Notes (Signed)
Pt was shown how to use the a spacer with her MDI that she was given at her MD office and pt was shown how to use and understood well.

## 2011-08-30 NOTE — ED Notes (Signed)
Pt dx with bronchitis on 3/20, only has 1 day abx left and almost completed steroids. Pt reports no improvement in cough and reports feeling weaker.

## 2011-08-30 NOTE — ED Provider Notes (Signed)
History     CSN: [id number]  Arrival date & time 08/30/11  2000   First MD Initiated Contact with Patient 08/30/11 2105      Chief Complaint  Patient presents with  . Cough    (Consider location/radiation/quality/duration/timing/severity/associated sxs/prior treatment) HPI Comments: Patient presents with cough symptoms since approximately one week ago.  Patient felt short of breath as well.  She was seen in the emergency department in [place] and was diagnosed with bronchitis and placed on antibiotics and given an albuterol inhaler and a prednisone burst.  Patient is almost completed with the antibiotics and had finished the prednisone but it still had symptoms.  She saw her primary care physician yesterday who put her on a continuation of steroids.  Patient comes in here today because of worsening shortness of breath and continued cough.  No fevers.  Patient does smoke one pack per day.  She's not officially carry a diagnosis of COPD or emphysema or asthma.  Patient is a 51 y.o. female presenting with cough. The history is provided by the patient. No language interpreter was used.  Cough This is a new problem. The current episode started more than 1 week ago. The problem occurs every few minutes. The problem has not changed since onset.There has been no fever. Associated symptoms include shortness of breath and wheezing. Pertinent negatives include no chest pain, no chills, no headaches and no eye redness.    Past Medical History  Diagnosis Date  . Ovarian cancer   . Anxiety   . Rapid heart rate   . Migraine   . Depression     Past Surgical History  Procedure Date  . Ovary surgery   . Cholecystectomy   . Abdominal hysterectomy   . Mouth surgery     stone  . Bladder tack   . Foot surgery     History reviewed. No pertinent family history.  History  Substance Use Topics  . Smoking status: Current Some Day Smoker -- 1.0 packs/day for 5 years    Types: Cigarettes  .  Smokeless tobacco: Not on file  . Alcohol Use: No    OB History    Grav Para Term Preterm Abortions TAB SAB Ect Mult Living                  Review of Systems  Constitutional: Negative.  Negative for fever and chills.  HENT: Negative.   Eyes: Negative.  Negative for discharge and redness.  Respiratory: Positive for cough, shortness of breath and wheezing.   Cardiovascular: Negative.  Negative for chest pain.  Gastrointestinal: Negative.  Negative for nausea, vomiting, abdominal pain and diarrhea.  Genitourinary: Negative.  Negative for dysuria and vaginal discharge.  Musculoskeletal: Negative.  Negative for back pain.  Skin: Negative.  Negative for color change and rash.  Neurological: Negative.  Negative for syncope and headaches.  Hematological: Negative.  Negative for adenopathy.  Psychiatric/Behavioral: Negative.  Negative for confusion.  All other systems reviewed and are negative.    Allergies  Iodine  Home Medications   Current Outpatient Rx  Name Route Sig Dispense Refill  . ALUM & MAG HYDROXIDE-SIMETH 200-200-20 MG/5ML PO SUSP Oral Take 10 mLs by mouth every 6 (six) hours as needed. Patient took this medication for heartburn and burping.    . ASPIRIN 81 MG PO TABS Oral Take 81 mg by mouth daily.      . CHLORPHEN-PSEUDOEPHED-APAP 2-30-325 MG PO TABS Oral Take by mouth.      Marland Kitchen  DIAZEPAM 5 MG PO TABS Oral Take 5 mg by mouth every 8 (eight) hours as needed.      Marland Kitchen ESOMEPRAZOLE MAGNESIUM 40 MG PO CPDR Oral Take 1 capsule (40 mg total) by mouth daily. 30 capsule 0  . OMEGA-3 FATTY ACIDS 1000 MG PO CAPS Oral Take 2 g by mouth daily.      Marland Kitchen FLUOXETINE HCL 10 MG PO TABS Oral Take 10 mg by mouth daily.    Marland Kitchen MIRTAZAPINE 30 MG PO TABS Oral Take 30 mg by mouth at bedtime.      Marland Kitchen PRAVASTATIN SODIUM 20 MG PO TABS Oral Take by mouth daily.      Marland Kitchen PROPRANOLOL HCL 10 MG PO TABS Oral Take 10 mg by mouth 2 (two) times daily.      Marland Kitchen ZOLPIDEM TARTRATE 10 MG PO TABS Oral Take 10 mg by  mouth at bedtime as needed.        BP 133/86  Pulse 95  Temp(Src) 98.7 F (37.1 C) (Oral)  Resp 19  Ht 5\' 6"  (1.676 m)  Wt 136 lb (61.689 kg)  BMI 21.95 kg/m2  SpO2 96%  Physical Exam  Nursing note and vitals reviewed. Constitutional: She is oriented to person, place, and time. She appears well-developed and well-nourished.  Non-toxic appearance. She does not have a sickly appearance.  HENT:  Head: Normocephalic and atraumatic.  Eyes: Conjunctivae, EOM and lids are normal. Pupils are equal, round, and reactive to light. No scleral icterus.  Neck: Trachea normal and normal range of motion. Neck supple.  Cardiovascular: Normal rate, regular rhythm and normal heart sounds.   Pulmonary/Chest: Effort normal and breath sounds normal. No respiratory distress. She has no wheezes. She has no rales.       My lung exam was after the respiratory therapist had already given the patient 2 nebulizer treatments per protocol.  Abdominal: Soft. Normal appearance. There is no tenderness. There is no rebound, no guarding and no CVA tenderness.  Musculoskeletal: Normal range of motion.  Neurological: She is alert and oriented to person, place, and time. She has normal strength.  Skin: Skin is warm, dry and intact. No rash noted.  Psychiatric: She has a normal mood and affect. Her behavior is normal. Judgment and thought content normal.    ED Course  Procedures (including critical care time)  Labs Reviewed - No data to display Dg Chest 2 View  08/30/2011  *RADIOLOGY REPORT*  Clinical Data: Cough.  CHEST - 2 VIEW  Comparison: 08/11/2011.  Findings: The cardiac silhouette, mediastinal and hilar contours are stable.  The lungs are clear.  No pleural effusion.  The bony thorax is intact.  IMPRESSION: Normal chest x-ray.  Original Report Authenticated By: P. Loralie Champagne, M.D.     No diagnosis found.    MDM  Patient with likely early COPD given her long smoking history one pack per day.  She is  Re: on steroids from her emergency department visit in [place] and continuation of those from her primary care physician yesterday.  I do not believe the patient needs further antibiotics as she is Re: completing a course of this is likely viral in origin.  Patient has been given further instruction from respiratory therapy and myself regarding appropriate use of her albuterol inhaler with a spacer.  Patient feels much improved after the breathing treatments here and I believe is safe for discharge home.  She'll followup with her primary care physician as well.  We had a long  discussion about quitting smoking and the importance of this and stopping the progression of her likely COPD.        Nat Christen, MD 08/30/11 [phone]

## 2011-10-06 ENCOUNTER — Encounter (HOSPITAL_BASED_OUTPATIENT_CLINIC_OR_DEPARTMENT_OTHER): Payer: Self-pay | Admitting: *Deleted

## 2011-10-06 ENCOUNTER — Emergency Department (HOSPITAL_BASED_OUTPATIENT_CLINIC_OR_DEPARTMENT_OTHER)
Admission: EM | Admit: 2011-10-06 | Discharge: 2011-10-07 | Disposition: A | Discharge status: Home / Self Care | Payer: Self-pay | Attending: Emergency Medicine | Admitting: Emergency Medicine

## 2011-10-06 DIAGNOSIS — F341 Dysthymic disorder: Secondary | ICD-10-CM | POA: Insufficient documentation

## 2011-10-06 DIAGNOSIS — R1013 Epigastric pain: Secondary | ICD-10-CM | POA: Insufficient documentation

## 2011-10-06 DIAGNOSIS — R1011 Right upper quadrant pain: Secondary | ICD-10-CM | POA: Insufficient documentation

## 2011-10-06 DIAGNOSIS — Z8543 Personal history of malignant neoplasm of ovary: Secondary | ICD-10-CM | POA: Insufficient documentation

## 2011-10-06 DIAGNOSIS — Z79899 Other long term (current) drug therapy: Secondary | ICD-10-CM | POA: Insufficient documentation

## 2011-10-06 LAB — CBC
Hemoglobin: 13 g/dL (ref 12.0–15.0)
MCH: 28.9 pg (ref 26.0–34.0)
MCV: 80.9 fL (ref 78.0–100.0)
RBC: 4.5 MIL/uL (ref 3.87–5.11)
WBC: 5.8 10*3/uL (ref 4.0–10.5)

## 2011-10-06 LAB — DIFFERENTIAL
Eosinophils Absolute: 0.1 10*3/uL (ref 0.0–0.7)
Eosinophils Relative: 2 % (ref 0–5)
Lymphocytes Relative: 26 % (ref 12–46)
Lymphs Abs: 1.5 10*3/uL (ref 0.7–4.0)
Monocytes Absolute: 0.6 10*3/uL (ref 0.1–1.0)
Monocytes Relative: 10 % (ref 3–12)

## 2011-10-06 MED ORDER — SODIUM CHLORIDE 0.9 % IV SOLN: INTRAVENOUS | Status: DC

## 2011-10-06 MED ORDER — LORAZEPAM 2 MG/ML IJ SOLN
1.0000 mg | Freq: Once | INTRAMUSCULAR | Status: AC
  Administered 2011-10-07: 2 mg via INTRAVENOUS
  Filled 2011-10-06: qty 1

## 2011-10-06 MED ORDER — SODIUM CHLORIDE 0.9 % IV BOLUS (SEPSIS)
1000.0000 mL | Freq: Once | INTRAVENOUS | Status: AC
  Administered 2011-10-07: 1000 mL via INTRAVENOUS

## 2011-10-06 NOTE — ED Provider Notes (Signed)
History     CSN: [id number]  Arrival date & time 10/06/11  2247   First MD Initiated Contact with Patient 10/06/11 2332      Chief Complaint  Patient presents with  . Abdominal Pain    (Consider location/radiation/quality/duration/timing/severity/associated sxs/prior treatment) HPI This 51 year old female has a history of ovarian cancer in the past and has had a hysterectomy as well as cholecystectomy. Over the last couple of months off-and-on she will have the right upper quadrant abdominal pains he can last for several hours to several days at a time. Those pains are not associated with any other symptoms such as chest pain cough shortness of breath nausea vomiting diarrhea dysuria bloody stools or other concerns. She has not seen her doctor while she has had these pains before. She was told to come to the emergency department by her doctor if she has recurrent pain. She has had several spells of these pains over the last couple of months. Since the pain started this evening over the last few hours she came to the ED for evaluation. She states she needs an anxiety medicine to have a CT scan done due to claustrophobia but wants to make sure she does not have an abdominal hernia. There's been no treatment prior to arrival her pain is mild to moderate now it was moderately severe prior to arrival. Her pain is localized mostly to the right upper quadrant radiating slightly to the right lower quadrant but not to the restaurant abdomen or back. Examination is significant for minimal right lower quadrant tenderness and minimal to mild right upper quadrant tenderness without rebound. Past Medical History  Diagnosis Date  . Ovarian cancer   . Anxiety   . Rapid heart rate   . Migraine   . Depression     Past Surgical History  Procedure Date  . Ovary surgery   . Cholecystectomy   . Abdominal hysterectomy   . Mouth surgery     stone  . Bladder tack   . Foot surgery     No family history on  file.  History  Substance Use Topics  . Smoking status: Current Some Day Smoker -- 1.0 packs/day for 5 years    Types: Cigarettes  . Smokeless tobacco: Not on file  . Alcohol Use: No    OB History    Grav Para Term Preterm Abortions TAB SAB Ect Mult Living                  Review of Systems  Constitutional: Negative for fever.       10 Systems reviewed and are negative for acute change except as noted in the HPI.  HENT: Negative for congestion.   Eyes: Negative for discharge and redness.  Respiratory: Negative for cough and shortness of breath.   Cardiovascular: Negative for chest pain.  Gastrointestinal: Positive for abdominal pain. Negative for vomiting and diarrhea.  Genitourinary: Negative for dysuria.  Musculoskeletal: Negative for back pain.  Skin: Negative for rash.  Neurological: Negative for syncope, numbness and headaches.  Psychiatric/Behavioral:       No behavior change.    Allergies  Iodine  Home Medications   Current Outpatient Rx  Name Route Sig Dispense Refill  . ASPIRIN 81 MG PO TABS Oral Take 81 mg by mouth daily.      Marland Kitchen DIAZEPAM 5 MG PO TABS Oral Take 5 mg by mouth every 8 (eight) hours as needed.      . OMEGA-3 FATTY ACIDS  1000 MG PO CAPS Oral Take 2 g by mouth daily.      Marland Kitchen FLUOXETINE HCL 10 MG PO TABS Oral Take 10 mg by mouth daily.    Marland Kitchen MIRTAZAPINE 30 MG PO TABS Oral Take 30 mg by mouth at bedtime.      Marland Kitchen PANTOPRAZOLE SODIUM 20 MG PO TBEC Oral Take 20 mg by mouth 2 (two) times daily.    Marland Kitchen PRAVASTATIN SODIUM 20 MG PO TABS Oral Take by mouth daily.      Marland Kitchen PROPRANOLOL HCL 10 MG PO TABS Oral Take 10 mg by mouth 2 (two) times daily.      Marland Kitchen ZOLPIDEM TARTRATE 10 MG PO TABS Oral Take 10 mg by mouth at bedtime as needed.        BP 100/71  Pulse 89  Temp(Src) 98.6 F (37 C) (Oral)  Resp 16  Ht 5\' 1"  (1.549 m)  Wt 142 lb (64.411 kg)  BMI 26.83 kg/m2  SpO2 100%  Physical Exam  Nursing note and vitals reviewed. Constitutional:       Awake,  alert, nontoxic appearance.  HENT:  Head: Atraumatic.  Eyes: Right eye exhibits no discharge. Left eye exhibits no discharge.  Neck: Neck supple.  Cardiovascular: Normal rate and regular rhythm.   No murmur heard. Pulmonary/Chest: Effort normal and breath sounds normal. No respiratory distress. She has no wheezes. She has no rales. She exhibits no tenderness.  Abdominal: Soft. Bowel sounds are normal. She exhibits no mass. There is tenderness. There is no rebound and no guarding.       Examination is significant for minimal right lower quadrant tenderness and minimal to mild right upper quadrant tenderness without rebound.  Musculoskeletal: She exhibits no tenderness.       Baseline ROM, no obvious new focal weakness.  Neurological:       Mental status and motor strength appears baseline for patient and situation.  Skin: No rash noted.  Psychiatric: She has a normal mood and affect.    ED Course  Procedures (including critical care time)  Labs Reviewed  COMPREHENSIVE METABOLIC PANEL - Abnormal; Notable for the following:    Total Bilirubin 0.2 (*)    GFR calc non Af Amer 73 (*)    GFR calc Af Amer 84 (*)    All other components within normal limits  URINALYSIS, ROUTINE W REFLEX MICROSCOPIC - Abnormal; Notable for the following:    Hgb urine dipstick TRACE (*)    All other components within normal limits  URINE MICROSCOPIC-ADD ON - Abnormal; Notable for the following:    Squamous Epithelial / LPF FEW (*)    All other components within normal limits  CBC  LIPASE, BLOOD  TROPONIN I  DIFFERENTIAL   Ct Abdomen Pelvis W Contrast  10/07/2011  *RADIOLOGY REPORT*  Clinical Data: Epigastric abdominal pain for 2 months.  CT ABDOMEN AND PELVIS WITH CONTRAST  Technique:  Multidetector CT imaging of the abdomen and pelvis was performed following the standard protocol during bolus administration of intravenous contrast.  Contrast: 100 mL of Omnipaque 300 IV contrast  Comparison: Abdominal  ultrasound performed 11/03/2009  Findings: The visualized lung bases are clear.  The liver and spleen are unremarkable in appearance.  The patient is status post cholecystectomy, with clips noted along the gallbladder fossa.  The pancreas and adrenal glands are unremarkable.  A 3.9 cm parapelvic cyst is noted at the right kidney.  The kidneys are otherwise unremarkable in appearance.  There is no evidence  of hydronephrosis.  No renal or ureteral stones are identified.  No free fluid is identified.  The small bowel is unremarkable in appearance.  The stomach is within normal limits.  No acute vascular abnormalities are seen.  Mild calcification is noted along the distal abdominal aorta and its branches.  The appendix is normal in caliber, without evidence for appendicitis.  The colon is unremarkable in appearance.  The bladder is mildly distended air within normal limits.  The patient is status post hysterectomy; no suspicious adnexal masses are identified.  No inguinal lymphadenopathy is seen.  No acute osseous abnormalities are identified.  IMPRESSION:  1.  No acute abnormalities seen within the abdomen or pelvis. 2.  3.9 cm right peripelvic renal cyst noted. 3.  Mild scattered calcification along the distal abdominal aorta and its branches.  Original Report Authenticated By: Tonia Ghent, M.D.     1. Abdominal pain       MDM  Pt stable in ED with no significant deterioration in condition.Patient / Family / Caregiver informed of clinical course, understand medical decision-making process, and agree with plan.I doubt any other EMC precluding discharge at this time including, but not necessarily limited to the following:SBI, peritonitis.       Hurman Horn, MD 10/07/11 [phone]

## 2011-10-06 NOTE — ED Notes (Signed)
Pt has had epigastric pains intermittently x2 months, but states it worsened tonight after eating dinner. Denies N/V/D or fevers. Pt had a cholecystectomy last year. Pt saw her PCP two weeks ago, and was told she may have a hernia. Was told to come to ER for worsening pain.

## 2011-10-07 ENCOUNTER — Emergency Department (INDEPENDENT_AMBULATORY_CARE_PROVIDER_SITE_OTHER): Payer: Self-pay

## 2011-10-07 DIAGNOSIS — N281 Cyst of kidney, acquired: Secondary | ICD-10-CM

## 2011-10-07 DIAGNOSIS — I700 Atherosclerosis of aorta: Secondary | ICD-10-CM

## 2011-10-07 DIAGNOSIS — R1013 Epigastric pain: Secondary | ICD-10-CM

## 2011-10-07 LAB — COMPREHENSIVE METABOLIC PANEL
ALT: 28 U/L (ref 0–35)
CO2: 27 mEq/L (ref 19–32)
Calcium: 9.4 mg/dL (ref 8.4–10.5)
Chloride: 103 mEq/L (ref 96–112)
GFR calc Af Amer: 84 mL/min — ABNORMAL LOW (ref >90)
GFR calc non Af Amer: 73 mL/min — ABNORMAL LOW (ref >90)
Glucose, Bld: 94 mg/dL (ref 70–99)
Sodium: 140 mEq/L (ref 135–145)
Total Bilirubin: 0.2 mg/dL — ABNORMAL LOW (ref 0.3–1.2)

## 2011-10-07 LAB — URINALYSIS, ROUTINE W REFLEX MICROSCOPIC
Bilirubin Urine: NEGATIVE
Glucose, UA: NEGATIVE mg/dL
Ketones, ur: NEGATIVE mg/dL
Protein, ur: NEGATIVE mg/dL
pH: 6.5 (ref 5.0–8.0)

## 2011-10-07 LAB — URINE MICROSCOPIC-ADD ON

## 2011-10-07 MED ORDER — IOHEXOL 300 MG/ML  SOLN
100.0000 mL | Freq: Once | INTRAMUSCULAR | Status: AC | PRN
  Administered 2011-10-07: 100 mL via INTRAVENOUS

## 2011-10-07 MED ORDER — IOHEXOL 300 MG/ML  SOLN
40.0000 mL | Freq: Once | INTRAMUSCULAR | Status: AC | PRN
  Administered 2011-10-07: 40 mL via ORAL

## 2011-10-07 NOTE — ED Notes (Signed)
Pt returned from ct, no s/s of reaction to contrast, pt stated she thinks it was a combination of shellfish and iodine dye combined that caused her initial reaction

## 2011-10-07 NOTE — Discharge Instructions (Signed)

## 2011-10-16 ENCOUNTER — Encounter (INDEPENDENT_AMBULATORY_CARE_PROVIDER_SITE_OTHER): Payer: Self-pay | Admitting: General Surgery

## 2011-10-23 ENCOUNTER — Encounter (INDEPENDENT_AMBULATORY_CARE_PROVIDER_SITE_OTHER): Payer: Self-pay | Admitting: General Surgery

## 2012-03-25 ENCOUNTER — Emergency Department (HOSPITAL_BASED_OUTPATIENT_CLINIC_OR_DEPARTMENT_OTHER): Payer: 59

## 2012-03-25 ENCOUNTER — Encounter (HOSPITAL_BASED_OUTPATIENT_CLINIC_OR_DEPARTMENT_OTHER): Payer: Self-pay | Admitting: *Deleted

## 2012-03-25 ENCOUNTER — Emergency Department (HOSPITAL_BASED_OUTPATIENT_CLINIC_OR_DEPARTMENT_OTHER)
Admission: EM | Admit: 2012-03-25 | Discharge: 2012-03-25 | Disposition: A | Discharge status: Home / Self Care | Payer: 59 | Attending: Emergency Medicine | Admitting: Emergency Medicine

## 2012-03-25 DIAGNOSIS — F329 Major depressive disorder, single episode, unspecified: Secondary | ICD-10-CM | POA: Insufficient documentation

## 2012-03-25 DIAGNOSIS — K219 Gastro-esophageal reflux disease without esophagitis: Secondary | ICD-10-CM

## 2012-03-25 DIAGNOSIS — Z7982 Long term (current) use of aspirin: Secondary | ICD-10-CM | POA: Insufficient documentation

## 2012-03-25 DIAGNOSIS — F411 Generalized anxiety disorder: Secondary | ICD-10-CM | POA: Insufficient documentation

## 2012-03-25 DIAGNOSIS — F3289 Other specified depressive episodes: Secondary | ICD-10-CM | POA: Insufficient documentation

## 2012-03-25 DIAGNOSIS — Z79899 Other long term (current) drug therapy: Secondary | ICD-10-CM | POA: Insufficient documentation

## 2012-03-25 DIAGNOSIS — F17200 Nicotine dependence, unspecified, uncomplicated: Secondary | ICD-10-CM | POA: Insufficient documentation

## 2012-03-25 DIAGNOSIS — Z8659 Personal history of other mental and behavioral disorders: Secondary | ICD-10-CM | POA: Insufficient documentation

## 2012-03-25 DIAGNOSIS — N393 Stress incontinence (female) (male): Secondary | ICD-10-CM | POA: Insufficient documentation

## 2012-03-25 LAB — COMPREHENSIVE METABOLIC PANEL
AST: 16 U/L (ref 0–37)
BUN: 13 mg/dL (ref 6–23)
CO2: 26 mEq/L (ref 19–32)
Calcium: 9.1 mg/dL (ref 8.4–10.5)
Chloride: 102 mEq/L (ref 96–112)
Creatinine, Ser: 1.1 mg/dL (ref 0.50–1.10)
GFR calc Af Amer: 66 mL/min — ABNORMAL LOW (ref >90)
GFR calc non Af Amer: 57 mL/min — ABNORMAL LOW (ref >90)
Glucose, Bld: 119 mg/dL — ABNORMAL HIGH (ref 70–99)
Total Bilirubin: 0.3 mg/dL (ref 0.3–1.2)

## 2012-03-25 LAB — CBC
HCT: 37.3 % (ref 36.0–46.0)
Hemoglobin: 12.9 g/dL (ref 12.0–15.0)
MCH: 27.5 pg (ref 26.0–34.0)
MCV: 79.5 fL (ref 78.0–100.0)
RBC: 4.69 MIL/uL (ref 3.87–5.11)
WBC: 5.1 10*3/uL (ref 4.0–10.5)

## 2012-03-25 LAB — TROPONIN I: Troponin I: <0.3 ng/mL (ref <0.30)

## 2012-03-25 MED ORDER — GI COCKTAIL ~~LOC~~
30.0000 mL | Freq: Once | ORAL | Status: AC
  Administered 2012-03-25: 30 mL via ORAL
  Filled 2012-03-25: qty 30

## 2012-03-25 NOTE — ED Notes (Signed)
Pt c/o chest pain x 1 hr ago which radiates to center of back,, denies n/v or SOB

## 2012-03-25 NOTE — ED Provider Notes (Signed)
History   This chart was scribed for Sarah Cooper III, MD by Sarah Sarah [name]. Sarah Sarah [name] was seen in room MH07/MH07 and Sarah Sarah [name]'s care was started at 2:57PM.     CSN: [id number]  Arrival date & time 03/25/12  1347   First MD Initiated Contact with Sarah [name] 03/25/12 1457      Chief Complaint  Sarah [name] presents with  . Chest Pain    (Consider location/radiation/quality/duration/timing/severity/associated sxs/prior treatment) Sarah [name] is a 51 y.o. female presenting with chest pain. Sarah history is provided by Sarah Sarah [name]. No language interpreter was used.  Chest Pain Sarah chest pain began 3 - 5 hours ago. Chest pain occurs intermittently. Sarah chest pain is worsening. Sarah pain is currently at 5/10. Sarah severity of Sarah pain is moderate. Sarah pain radiates to Sarah mid back. Pertinent negatives for primary symptoms include no fever, no syncope, no shortness of breath, no cough, no nausea and no vomiting. She tried antacids for Sarah symptoms.     Sarah Sarah [name] is a 51 y.o. female who presents to [facility] Emergency Department complaining of  intermittent, progressively worsening, chest pain, located at Sarah center of Sarah chest, radiating towards Sarah center of Sarah back, onset today (12:00PM).  Associated symptoms include tingling located within Sarah left upper extremity (onset yesterday, 03/24/12) and left ear pain (onset yesterday, 03/24/12). Sarah pt describes Sarah chest pain as an intermittent pain, coming and going, rated at 5/10 at present. In addition, Sarah pt informs that she has experienced chest pains in Sarah past, however, none of which have radiated towards her back. Modifying factors include taking protonics and liquid antacid which provides moderate relief of Sarah chest pain. Sarah pt has a hx of anxiety, rapid heart rate, stress incontinence, hysterectomy, neck surgery, and cholecystectomy. In addition, Sarah pt has allergies to Avelox, iodine (IV contrast dye), and clonazepam.   Sarah pt denies nausea, shortness  of breath, fever, difficulty with vision, cough, vomiting, diarrhea, difficulty urinating, rash, fainting, and seizure activity.   Sarah pt is a current everyday smoker (1.0 packs/day), however, she does not drink alcohol.   PCP is Dr. Tommi [name].    Past Medical History  Diagnosis Date  . Ovarian cancer   . Anxiety   . Rapid heart rate   . Migraine   . Depression   . Stress incontinence     Past Surgical History  Procedure Date  . Ovary surgery   . Cholecystectomy   . Abdominal hysterectomy   . Mouth surgery     stone  . Bladder tack   . Foot surgery     History reviewed. No pertinent family history.  History  Substance Use Topics  . Smoking status: Current Some Day Smoker -- 1.0 packs/day for 5 years    Types: Cigarettes  . Smokeless tobacco: Not on file  . Alcohol Use: No    OB History    Grav Para Term Preterm Abortions TAB SAB Ect Mult Living                  Review of Systems  Constitutional: Negative for fever.  Respiratory: Negative for cough and shortness of breath.   Cardiovascular: Positive for chest pain. Negative for syncope.  Gastrointestinal: Negative for nausea and vomiting.  All other systems reviewed and are negative.    Allergies  Avelox; Iodine; and Clonazepam  Home Medications   Current Outpatient Rx  Name Route Sig Dispense Refill  . ASPIRIN 81 MG PO TABS Oral Take 81  mg by mouth daily.      Marland Kitchen DIAZEPAM 5 MG PO TABS Oral Take 5 mg by mouth every 8 (eight) hours as needed.      . OMEGA-3 FATTY ACIDS 1000 MG PO CAPS Oral Take 2 g by mouth daily.      Marland Kitchen FLUOXETINE HCL 10 MG PO TABS Oral Take 10 mg by mouth daily.    Marland Kitchen MIRTAZAPINE 30 MG PO TABS Oral Take 30 mg by mouth at bedtime.      Marland Kitchen PANTOPRAZOLE SODIUM 20 MG PO TBEC Oral Take 20 mg by mouth 2 (two) times daily.    Marland Kitchen PRAVASTATIN SODIUM 20 MG PO TABS Oral Take by mouth daily.      Marland Kitchen PROPRANOLOL HCL 10 MG PO TABS Oral Take 10 mg by mouth 2 (two) times daily.      Marland Kitchen ZOLPIDEM TARTRATE 10  MG PO TABS Oral Take 10 mg by mouth at bedtime as needed.        BP 122/72  Pulse 83  Temp 98.2 F (36.8 C) (Oral)  Resp 16  Ht 5\' 2"  (1.575 m)  Wt 138 lb (62.596 kg)  BMI 25.24 kg/m2  SpO2 100%  Physical Exam  Nursing note and vitals reviewed. Constitutional: She is oriented to person, place, and time. She appears well-developed and well-nourished.  HENT:  Head: Atraumatic.  Right Ear: Tympanic membrane normal.  Left Ear: Tympanic membrane normal.  Nose: Nose normal.  Mouth/Throat: Oropharynx is clear and moist.  Eyes: Conjunctivae normal and EOM are normal.  Neck: Normal range of motion. Neck supple.  Cardiovascular: Normal rate, regular rhythm and normal heart sounds.   Pulmonary/Chest: Effort normal and breath sounds normal.  Abdominal: Soft. Bowel sounds are normal. She exhibits no mass. There is tenderness.       Epigastric tenderness detected.   Musculoskeletal: Normal range of motion. She exhibits no edema and no tenderness.  Lymphadenopathy:    She has no cervical adenopathy.  Neurological: She is alert and oriented to person, place, and time.       Sarah pt is neurovascularly intact.   Skin: Skin is warm and dry.  Psychiatric: She has a normal mood and affect. Her behavior is normal.    ED Course  Procedures (including critical care time)  DIAGNOSTIC STUDIES: Oxygen Saturation is 100% on room air, normal by my interpretation.    COORDINATION OF CARE:   3:09 PM- Treatment plan concerning EKG results, and blood work discussed with Sarah [name]. Pt agrees with treatment.   4:09 PM- Treatment plan concerning laboratory and chest x-ray results discussed with Sarah [name]. Pt agrees with treatment.    2:57 PM  Date: 03/25/2012  Rate:84  Rhythm: sinus tachycardia  QRS Axis: normal  Intervals: normal  ST/T Wave abnormalities: nonspecific T wave changes  Conduction Disutrbances:none  Narrative Interpretation: Abnormal EKG  Old EKG Reviewed: unchanged     Results  for orders placed during Sarah hospital encounter of 03/25/12  CBC      Component Value Range   WBC 5.1  4.0 - 10.5 K/uL   RBC 4.69  3.87 - 5.11 MIL/uL   Hemoglobin 12.9  12.0 - 15.0 g/dL   HCT 16.1  09.6 - 04.5 %   MCV 79.5  78.0 - 100.0 fL   MCH 27.5  26.0 - 34.0 pg   MCHC 34.6  30.0 - 36.0 g/dL   RDW 40.9  81.1 - 91.4 %   Platelets 159  150 - 400 K/uL  COMPREHENSIVE METABOLIC PANEL      Component Value Range   Sodium 139  135 - 145 mEq/L   Potassium 3.8  3.5 - 5.1 mEq/L   Chloride 102  96 - 112 mEq/L   CO2 26  19 - 32 mEq/L   Glucose, Bld 119 (*) 70 - 99 mg/dL   BUN 13  6 - 23 mg/dL   Creatinine, Ser 2.95  0.50 - 1.10 mg/dL   Calcium 9.1  8.4 - 62.1 mg/dL   Total Protein 6.9  6.0 - 8.3 g/dL   Albumin 4.0  3.5 - 5.2 g/dL   AST 16  0 - 37 U/L   ALT 17  0 - 35 U/L   Alkaline Phosphatase 78  39 - 117 U/L   Total Bilirubin 0.3  0.3 - 1.2 mg/dL   GFR calc non Af Amer 57 (*) >90 mL/min   GFR calc Af Amer 66 (*) >90 mL/min  TROPONIN I      Component Value Range   Troponin I <0.30  <0.30 ng/mL   Dg Chest 2 View  03/25/2012  *RADIOLOGY REPORT*  Clinical Data: Chest pain radiates into Sarah back.  History of rapid heart rate, cholecystectomy, hysterectomy.  History of smoking, ovarian cancer.  CHEST - 2 VIEW  Comparison: 08/30/2011  Findings: Cardiomediastinal silhouette is within normal limits. Sarah lungs are free of focal consolidations and pleural effusions. No edema.  Sarah surgical clips are identified in Sarah right upper quadrant of Sarah abdomen. Visualized osseous structures have a normal appearance.  IMPRESSION: No evidence for acute cardiopulmonary abnormality.   Original Report Authenticated By: Patterson Hammersmith, M.D.     4:11 PM EKG, lab tests and chest x-ray all negative.  Her symptoms suggest GERD.  Advised to continue to take Protonix, as well as antacids after meals and ad bed time.  She should try to stop smoking.  1. GERD (gastroesophageal reflux disease)     I  personally performed Sarah services described in this documentation, which was scribed in my presence. Sarah recorded information has been reviewed and considered.  Osvaldo Human, MD          Sarah Cooper III, MD 03/25/12 [phone]

## 2012-03-25 NOTE — ED Notes (Signed)
MD at bedside. 

## 2012-04-05 IMAGING — US US ABDOMEN COMPLETE
1 series · 14 of 25 positions shown · non-contrast
Comparison: None.

CLINICAL DATA: Abdominal pain

COMPLETE ABDOMINAL ULTRASOUND

[Series 1: us abdomen complete · 0.21mm/px · 14 of 70 slices shown]
[im 1/70]
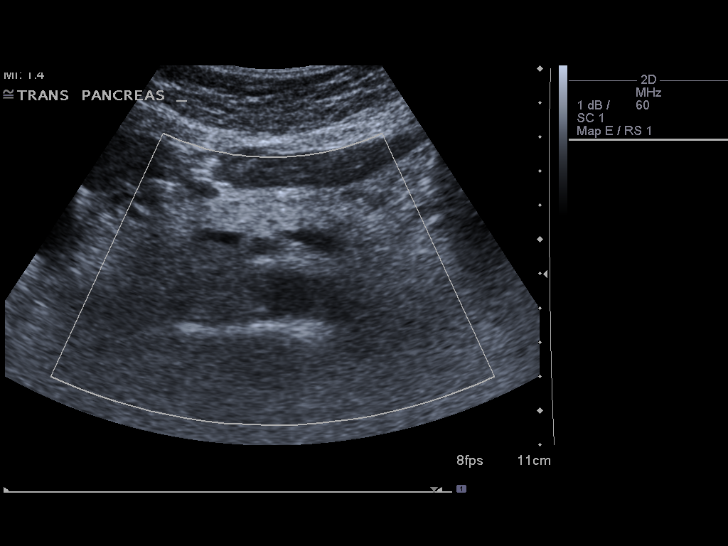
[im 6/70]
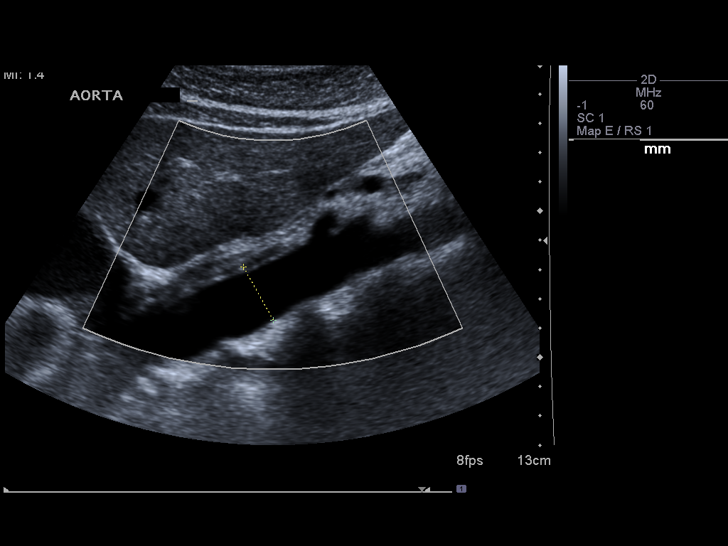
[im 12/70]
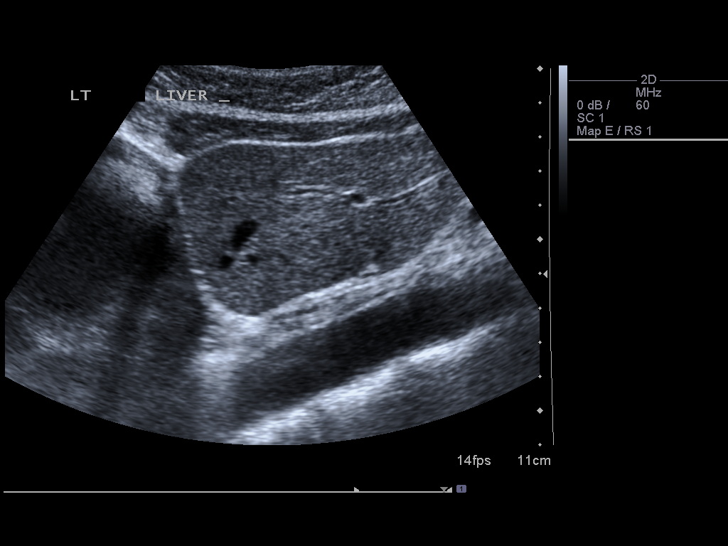
[im 18/70]
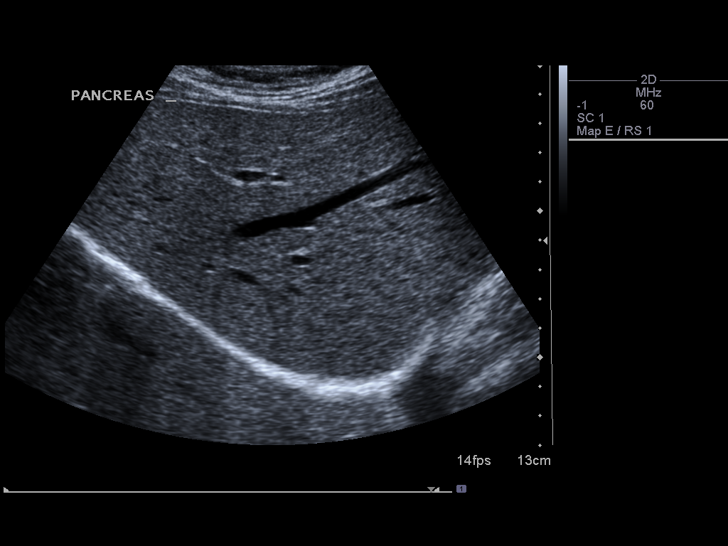
[im 24/70]
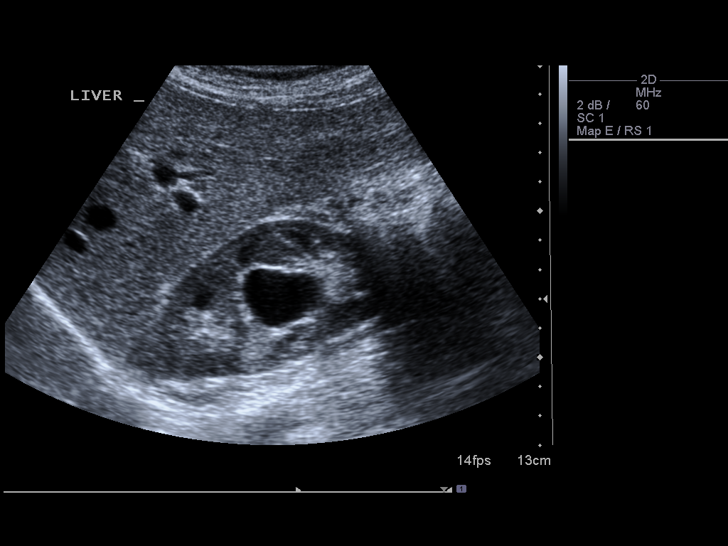
[im 26/70]
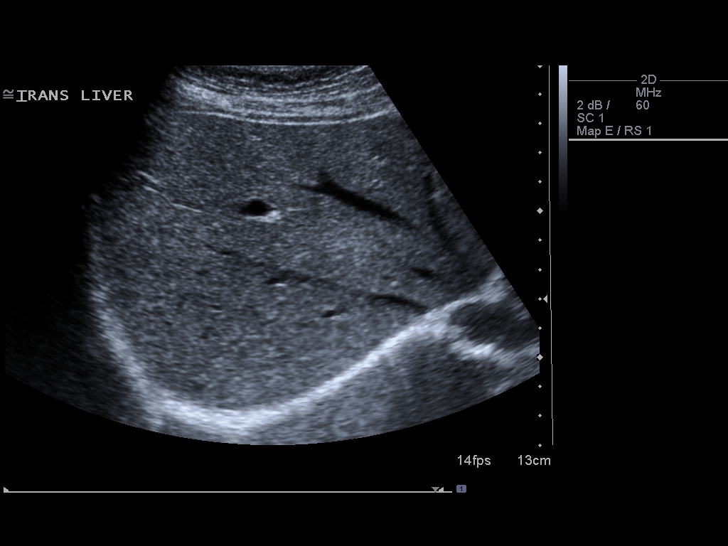
[im 32/70]
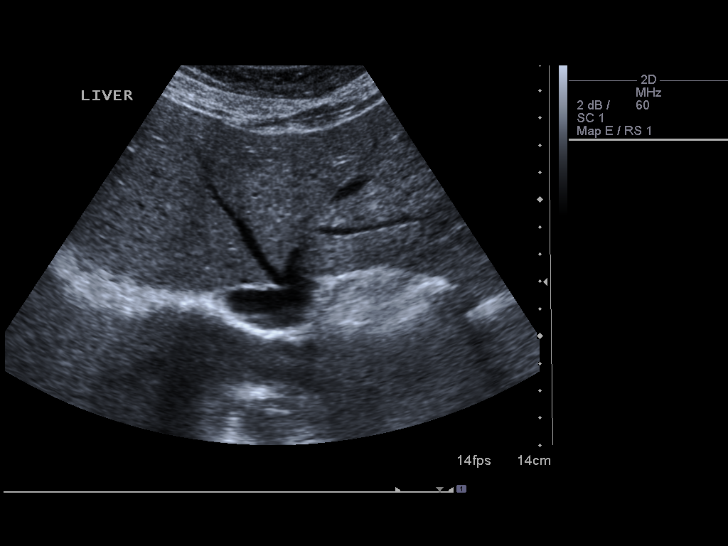
[im 38/70]
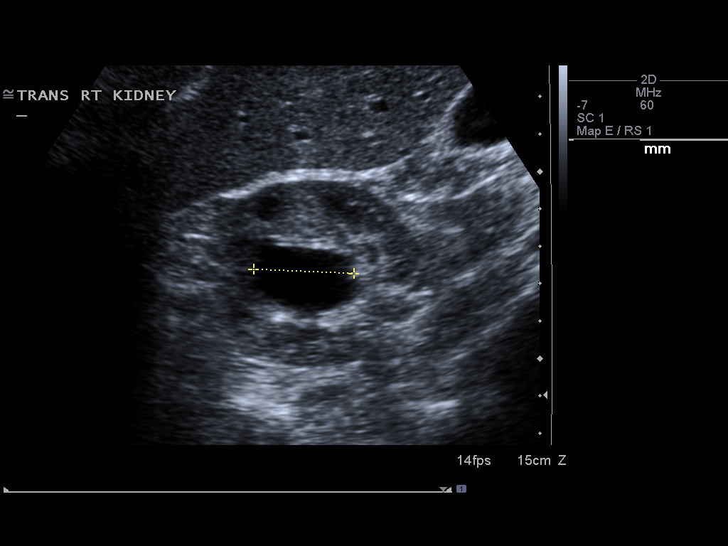
[im 44/70]
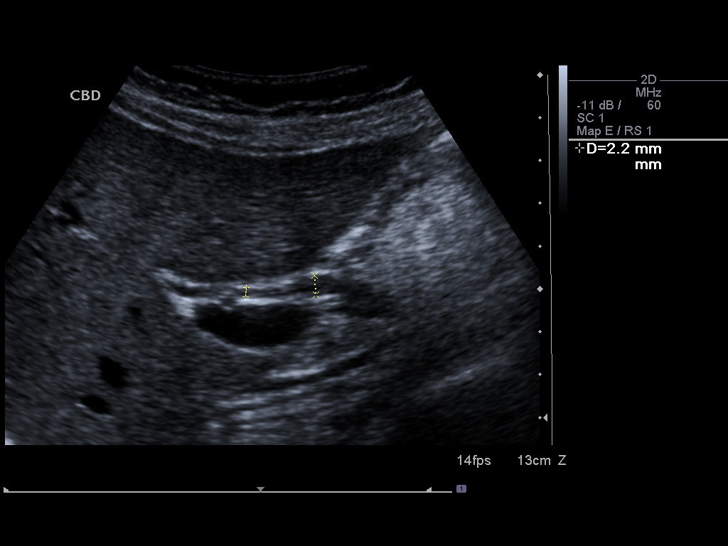
[im 47/70]
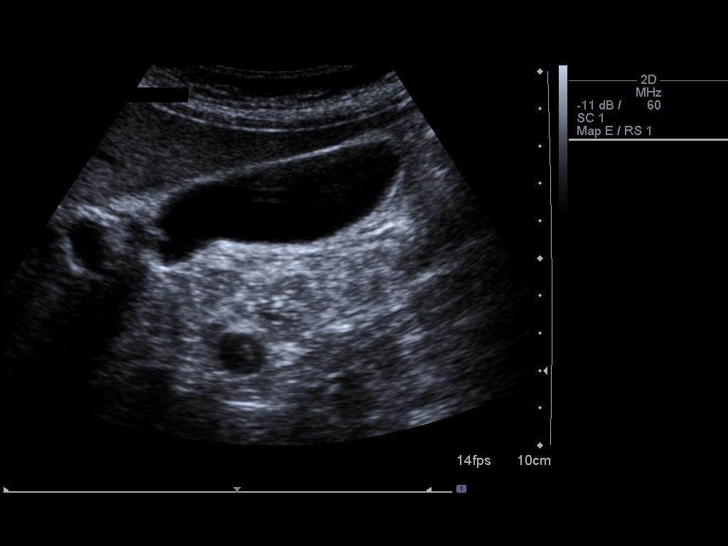
[im 52/70]
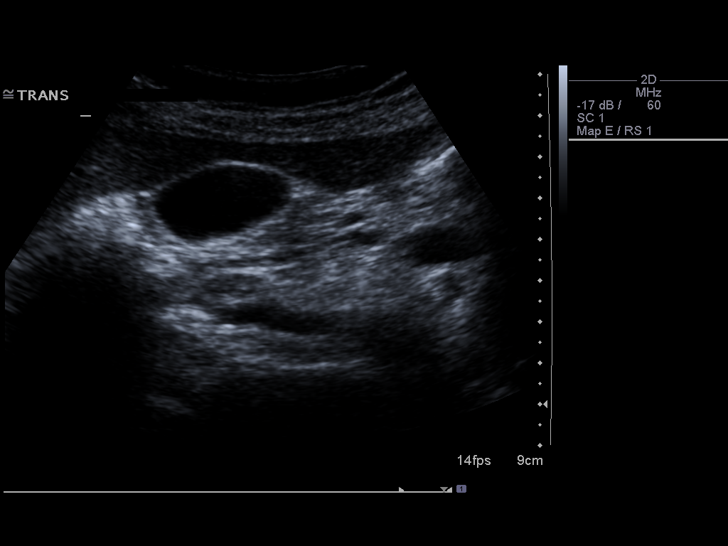
[im 58/70]
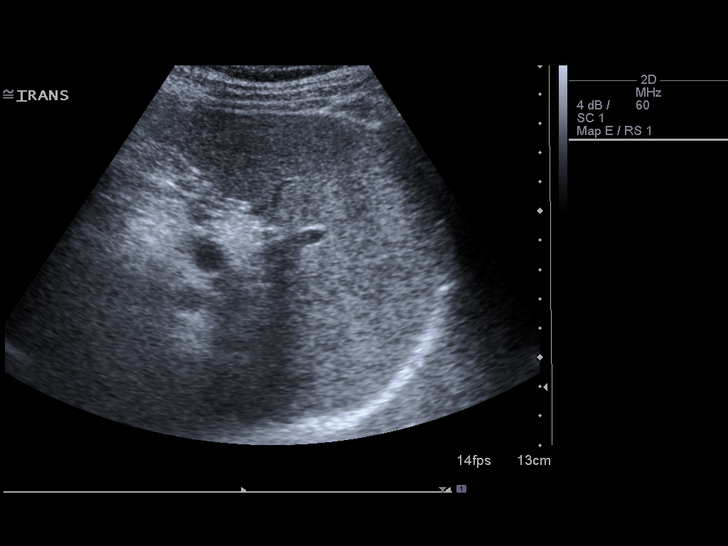
[im 64/70]
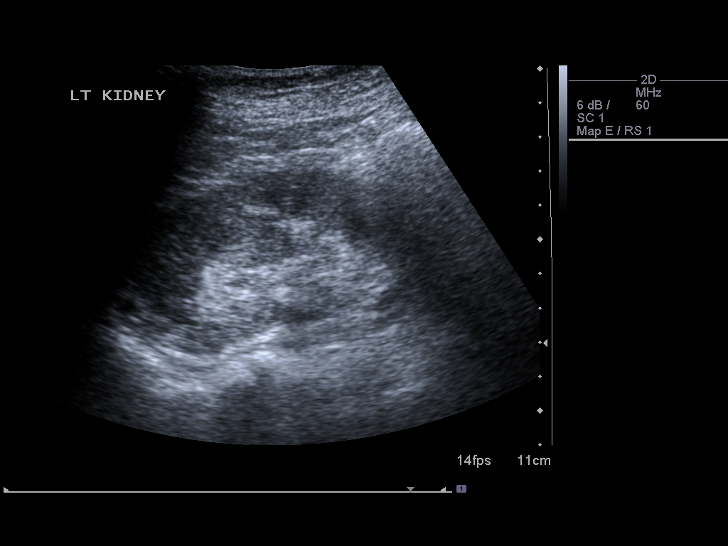
[im 70/70]
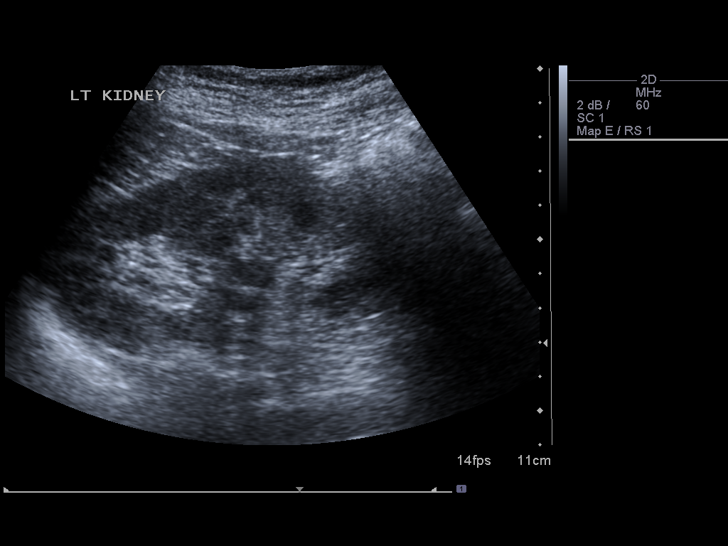

[14 of 25 positions shown; findings below may reference images not displayed]

FINDINGS: Gallbladder:  No gallstones, gallbladder wall thickening, or
pericholecystic fluid.

Common bile duct:  4.5 mm

Liver:  No focal lesion identified.  Within normal limits in
parenchymal echogenicity.

IVC:  Normal

Pancreas:  Normal

Spleen:  7.9 cm in length.  No lesions.

Right Kidney:  10.3 cm in length.  There is a 2.7 cm peripelvic
cyst.  No hydronephrosis or other pathology.

Left Kidney:  Suboptimally visualized due to overlying bowel gas.
Estimated length 11.2 cm.  No obvious hydronephrosis or pathology.

Abdominal aorta:  Normal
IMPRESSION: No acute or significant findings.  There is a 2.7 cm left renal
peripelvic cyst.

## 2012-05-30 ENCOUNTER — Emergency Department (HOSPITAL_BASED_OUTPATIENT_CLINIC_OR_DEPARTMENT_OTHER)
Admission: EM | Admit: 2012-05-30 | Discharge: 2012-05-31 | Disposition: A | Discharge status: Home / Self Care | Payer: 59 | Attending: Emergency Medicine | Admitting: Emergency Medicine

## 2012-05-30 ENCOUNTER — Emergency Department (HOSPITAL_BASED_OUTPATIENT_CLINIC_OR_DEPARTMENT_OTHER): Payer: 59

## 2012-05-30 ENCOUNTER — Encounter (HOSPITAL_BASED_OUTPATIENT_CLINIC_OR_DEPARTMENT_OTHER): Payer: Self-pay | Admitting: *Deleted

## 2012-05-30 DIAGNOSIS — J209 Acute bronchitis, unspecified: Secondary | ICD-10-CM | POA: Insufficient documentation

## 2012-05-30 DIAGNOSIS — J069 Acute upper respiratory infection, unspecified: Secondary | ICD-10-CM | POA: Insufficient documentation

## 2012-05-30 DIAGNOSIS — Z8742 Personal history of other diseases of the female genital tract: Secondary | ICD-10-CM | POA: Insufficient documentation

## 2012-05-30 DIAGNOSIS — F17200 Nicotine dependence, unspecified, uncomplicated: Secondary | ICD-10-CM | POA: Insufficient documentation

## 2012-05-30 DIAGNOSIS — J3489 Other specified disorders of nose and nasal sinuses: Secondary | ICD-10-CM | POA: Insufficient documentation

## 2012-05-30 DIAGNOSIS — F329 Major depressive disorder, single episode, unspecified: Secondary | ICD-10-CM | POA: Insufficient documentation

## 2012-05-30 DIAGNOSIS — R000 Tachycardia, unspecified: Secondary | ICD-10-CM | POA: Insufficient documentation

## 2012-05-30 DIAGNOSIS — Z79899 Other long term (current) drug therapy: Secondary | ICD-10-CM | POA: Insufficient documentation

## 2012-05-30 DIAGNOSIS — F411 Generalized anxiety disorder: Secondary | ICD-10-CM | POA: Insufficient documentation

## 2012-05-30 DIAGNOSIS — Z7982 Long term (current) use of aspirin: Secondary | ICD-10-CM | POA: Insufficient documentation

## 2012-05-30 DIAGNOSIS — F3289 Other specified depressive episodes: Secondary | ICD-10-CM | POA: Insufficient documentation

## 2012-05-30 DIAGNOSIS — Z8679 Personal history of other diseases of the circulatory system: Secondary | ICD-10-CM | POA: Insufficient documentation

## 2012-05-30 DIAGNOSIS — H9209 Otalgia, unspecified ear: Secondary | ICD-10-CM | POA: Insufficient documentation

## 2012-05-30 DIAGNOSIS — R51 Headache: Secondary | ICD-10-CM | POA: Insufficient documentation

## 2012-05-30 MED ORDER — ALBUTEROL SULFATE HFA 108 (90 BASE) MCG/ACT IN AERS: 2.0000 | INHALATION_SPRAY | RESPIRATORY_TRACT | Status: DC | PRN

## 2012-05-30 MED ORDER — PREDNISONE 20 MG PO TABS: 40.0000 mg | ORAL_TABLET | Freq: Every day | ORAL | Status: DC

## 2012-05-30 MED ORDER — BENZONATATE 100 MG PO CAPS: 200.0000 mg | ORAL_CAPSULE | Freq: Two times a day (BID) | ORAL | Status: DC | PRN

## 2012-05-30 MED ORDER — PREDNISONE 50 MG PO TABS
60.0000 mg | ORAL_TABLET | Freq: Once | ORAL | Status: AC
  Administered 2012-05-30: 60 mg via ORAL
  Filled 2012-05-30 (×2): qty 1

## 2012-05-30 MED ORDER — GUAIFENESIN 100 MG/5ML PO LIQD: 100.0000 mg | ORAL | Status: DC | PRN

## 2012-05-30 NOTE — ED Provider Notes (Signed)
History     CSN: [id number]  Arrival date & time 05/30/12  1906   First MD Initiated Contact with Patient 05/30/12 2147      Chief Complaint  Patient presents with  . Cough    (Consider location/radiation/quality/duration/timing/severity/associated sxs/prior treatment) Patient is a 51 y.o. female presenting with cough. The history is provided by the patient. No language interpreter was used.  Cough This is a new problem. The current episode started more than 2 days ago (4 days). The problem occurs every few minutes. The problem has not changed since onset.The cough is productive of sputum. There has been no fever. Associated symptoms include ear pain, headaches and rhinorrhea. Pertinent negatives include no chest pain, no chills, no sweats, no weight loss, no sore throat, no myalgias, no shortness of breath and no wheezing. Treatments tried: albuterol. The treatment provided mild relief. She is a smoker. Her past medical history is significant for bronchitis.    Past Medical History  Diagnosis Date  . Ovarian cancer   . Anxiety   . Rapid heart rate   . Migraine   . Depression   . Stress incontinence     Past Surgical History  Procedure Date  . Ovary surgery   . Cholecystectomy   . Abdominal hysterectomy   . Mouth surgery     stone  . Bladder tack   . Foot surgery     History reviewed. No pertinent family history.  History  Substance Use Topics  . Smoking status: Current Some Day Smoker -- 1.0 packs/day for 5 years    Types: Cigarettes  . Smokeless tobacco: Not on file  . Alcohol Use: No    OB History    Grav Para Term Preterm Abortions TAB SAB Ect Mult Living                  Review of Systems  Constitutional: Negative for chills and weight loss.  HENT: Positive for ear pain and rhinorrhea. Negative for sore throat.   Respiratory: Positive for cough. Negative for shortness of breath and wheezing.   Cardiovascular: Negative for chest pain.    Musculoskeletal: Negative for myalgias.  Neurological: Positive for headaches.    Allergies  Avelox; Iodine; and Clonazepam  Home Medications   Current Outpatient Rx  Name  Route  Sig  Dispense  Refill  . ASPIRIN 81 MG PO TABS   Oral   Take 81 mg by mouth daily.           . OMEGA-3 FATTY ACIDS 1000 MG PO CAPS   Oral   Take 2 g by mouth daily.           Marland Kitchen MIRTAZAPINE 30 MG PO TABS   Oral   Take 30 mg by mouth at bedtime.           Marland Kitchen PANTOPRAZOLE SODIUM 20 MG PO TBEC   Oral   Take 20 mg by mouth 2 (two) times daily.         Marland Kitchen PRAVASTATIN SODIUM 20 MG PO TABS   Oral   Take by mouth daily.           Marland Kitchen PROPRANOLOL HCL 10 MG PO TABS   Oral   Take 10 mg by mouth 2 (two) times daily.           Marland Kitchen ZOLPIDEM TARTRATE 10 MG PO TABS   Oral   Take 10 mg by mouth at bedtime as needed.           Marland Kitchen  DIAZEPAM 5 MG PO TABS   Oral   Take 5 mg by mouth every 8 (eight) hours as needed.           Marland Kitchen FLUOXETINE HCL 10 MG PO TABS   Oral   Take 10 mg by mouth daily.           BP 98/53  Pulse 93  Temp 98.2 F (36.8 C) (Oral)  Resp 19  Ht 5\' 2"  (1.575 m)  Wt 151 lb (68.493 kg)  BMI 27.62 kg/m2  SpO2 97%  Physical Exam  Nursing note and vitals reviewed. Constitutional: She is oriented to person, place, and time. She appears well-developed and well-nourished. No distress. She is not intubated.  HENT:  Head: Normocephalic and atraumatic. Head is without right periorbital erythema and without left periorbital erythema. No trismus in the jaw.  Right Ear: Tympanic membrane, external ear and ear canal normal.  Left Ear: Tympanic membrane, external ear and ear canal normal.  Nose: Rhinorrhea present. No mucosal edema or sinus tenderness. No epistaxis. Right sinus exhibits no maxillary sinus tenderness and no frontal sinus tenderness. Left sinus exhibits no maxillary sinus tenderness and no frontal sinus tenderness.  Mouth/Throat: Uvula is midline, oropharynx is clear  and moist and mucous membranes are normal. Mucous membranes are not pale, not dry and not cyanotic. No uvula swelling. No oropharyngeal exudate, posterior oropharyngeal edema, posterior oropharyngeal erythema or tonsillar abscesses.  Eyes: Pupils are equal, round, and reactive to light. Right eye exhibits no discharge. Left eye exhibits no discharge. No scleral icterus.  Neck: Neck supple. No JVD present. No tracheal deviation present.  Cardiovascular: Normal rate, regular rhythm, normal heart sounds and intact distal pulses.  Exam reveals no gallop and no friction rub.   No murmur heard. Pulmonary/Chest: Effort normal. No accessory muscle usage or stridor. No apnea, not tachypneic and not bradypneic. She is not intubated. No respiratory distress. She has no decreased breath sounds. She has wheezes. She has no rhonchi. She has no rales. She exhibits no tenderness.  Abdominal: Soft. Bowel sounds are normal. She exhibits no distension. There is no tenderness. There is no rebound and no guarding.  Musculoskeletal: Normal range of motion. She exhibits no edema and no tenderness.  Lymphadenopathy:    She has no cervical adenopathy.  Neurological: She is alert and oriented to person, place, and time. No cranial nerve deficit.  Skin: Skin is warm and dry. No rash noted. She is not diaphoretic. No erythema. No pallor.  Psychiatric: She has a normal mood and affect. Her behavior is normal.    ED Course  Procedures (including critical care time)  Labs Reviewed - No data to display Dg Chest 2 View  05/30/2012  *RADIOLOGY REPORT*  Clinical Data: 51 year old female with cough, chest pain and shortness of breath.  CHEST - 2 VIEW  Comparison: 10/21/2013and prior chest radiographs dating back to 10/15/2008  Findings: The cardiomediastinal silhouette is unremarkable. COPD/emphysema is noted. There is no evidence of focal airspace disease, pulmonary edema, suspicious pulmonary nodule/mass, pleural effusion, or  pneumothorax. No acute bony abnormalities are identified.  IMPRESSION: COPD/emphysema without evidence of acute cardiopulmonary disease.,   Original Report Authenticated By: Harmon Pier, M.D.      No diagnosis found.    MDM  Pt presents for evaluation of a mildly productive cough and post-nasal drip.  She appears nontoxic, note nl respiratory effort, NAD.  She has mild wheezing bilaterally without clinical evidence of consolidation.   She is a smoker  and although no acute infiltrate is seen on CXR, she has changes consistent with chronic COPD.  Plan symptomatic mgmnt with albuterol, prednisone taper, and tessalon.  Encouraged outpt follow-up.        Tobin Chad, MD 05/30/12 2237

## 2012-05-30 NOTE — ED Notes (Signed)
C/o productive cough since Monday with body aches and low grade fever.

## 2012-12-07 ENCOUNTER — Emergency Department (HOSPITAL_BASED_OUTPATIENT_CLINIC_OR_DEPARTMENT_OTHER)
Admission: EM | Admit: 2012-12-07 | Discharge: 2012-12-08 | Disposition: A | Discharge status: Home / Self Care | Payer: No Typology Code available for payment source | Attending: Emergency Medicine | Admitting: Emergency Medicine

## 2012-12-07 ENCOUNTER — Encounter (HOSPITAL_BASED_OUTPATIENT_CLINIC_OR_DEPARTMENT_OTHER): Payer: Self-pay | Admitting: *Deleted

## 2012-12-07 DIAGNOSIS — R61 Generalized hyperhidrosis: Secondary | ICD-10-CM | POA: Insufficient documentation

## 2012-12-07 DIAGNOSIS — F3289 Other specified depressive episodes: Secondary | ICD-10-CM | POA: Insufficient documentation

## 2012-12-07 DIAGNOSIS — Z9089 Acquired absence of other organs: Secondary | ICD-10-CM | POA: Insufficient documentation

## 2012-12-07 DIAGNOSIS — Z79899 Other long term (current) drug therapy: Secondary | ICD-10-CM | POA: Insufficient documentation

## 2012-12-07 DIAGNOSIS — H9209 Otalgia, unspecified ear: Secondary | ICD-10-CM | POA: Insufficient documentation

## 2012-12-07 DIAGNOSIS — R110 Nausea: Secondary | ICD-10-CM | POA: Insufficient documentation

## 2012-12-07 DIAGNOSIS — Z8543 Personal history of malignant neoplasm of ovary: Secondary | ICD-10-CM | POA: Insufficient documentation

## 2012-12-07 DIAGNOSIS — F329 Major depressive disorder, single episode, unspecified: Secondary | ICD-10-CM | POA: Insufficient documentation

## 2012-12-07 DIAGNOSIS — R079 Chest pain, unspecified: Secondary | ICD-10-CM | POA: Insufficient documentation

## 2012-12-07 DIAGNOSIS — F411 Generalized anxiety disorder: Secondary | ICD-10-CM | POA: Insufficient documentation

## 2012-12-07 DIAGNOSIS — Z8742 Personal history of other diseases of the female genital tract: Secondary | ICD-10-CM | POA: Insufficient documentation

## 2012-12-07 DIAGNOSIS — G43909 Migraine, unspecified, not intractable, without status migrainosus: Secondary | ICD-10-CM | POA: Insufficient documentation

## 2012-12-07 DIAGNOSIS — R6883 Chills (without fever): Secondary | ICD-10-CM | POA: Insufficient documentation

## 2012-12-07 DIAGNOSIS — F17200 Nicotine dependence, unspecified, uncomplicated: Secondary | ICD-10-CM | POA: Insufficient documentation

## 2012-12-07 DIAGNOSIS — IMO0002 Reserved for concepts with insufficient information to code with codable children: Secondary | ICD-10-CM | POA: Insufficient documentation

## 2012-12-07 DIAGNOSIS — Z7982 Long term (current) use of aspirin: Secondary | ICD-10-CM | POA: Insufficient documentation

## 2012-12-07 DIAGNOSIS — K219 Gastro-esophageal reflux disease without esophagitis: Secondary | ICD-10-CM | POA: Insufficient documentation

## 2012-12-07 LAB — URINALYSIS, ROUTINE W REFLEX MICROSCOPIC
Glucose, UA: NEGATIVE mg/dL
Ketones, ur: NEGATIVE mg/dL
Protein, ur: NEGATIVE mg/dL
Urobilinogen, UA: 0.2 mg/dL (ref 0.0–1.0)

## 2012-12-07 LAB — URINE MICROSCOPIC-ADD ON

## 2012-12-07 NOTE — ED Provider Notes (Signed)
History  This chart was scribed for Sarah [name] Smitty Cords, MD by Greggory Stallion, ED Scribe. This patient was seen in room MH07/MH07 and the patient's care was started at 11:57 PM.  CSN: [id number] Arrival date & time 12/07/12  2158   Chief Complaint  Patient presents with  . Abdominal Pain   Patient is a 52 y.o. female presenting with abdominal pain. The history is provided by the patient. No language interpreter was used.  Abdominal Pain This is a new problem. The current episode started 2 days ago. The problem occurs constantly. The problem has not changed since onset.Associated symptoms include chest pain and abdominal pain. Pertinent negatives include no headaches and no shortness of breath. Nothing aggravates the symptoms. Nothing relieves the symptoms. She has tried nothing for the symptoms. The treatment provided no relief.    HPI Comments: ARDINE Sarah [name] is a 52 y.o. female who presents to the Emergency Department complaining of lower abdominal pain that radiates into her chest that started 2 days ago. Pt states she has nausea, diaphoresis and chills at times. She states she also has mild left ear pain. Pt denies diarrhea, constipation, emesis, dysuria, and other urinary symptoms as associated symptoms. She states she is having normal bowel movement. Pt states she has a h/o acid reflux and she has not missed any of her medications. She states her medication was recently changed.   Past Medical History  Diagnosis Date  . Ovarian cancer   . Anxiety   . Rapid heart rate   . Migraine   . Depression   . Stress incontinence    Past Surgical History  Procedure Laterality Date  . Ovary surgery    . Cholecystectomy    . Abdominal hysterectomy    . Mouth surgery      stone  . Bladder tack    . Foot surgery     History reviewed. No pertinent family history. History  Substance Use Topics  . Smoking status: Current Some Day Smoker -- 1.00 packs/day for 5 years    Types: Cigarettes   . Smokeless tobacco: Not on file  . Alcohol Use: No   OB History   Grav Para Term Preterm Abortions TAB SAB Ect Mult Living                 Review of Systems  Respiratory: Negative for cough and shortness of breath.   Cardiovascular: Positive for chest pain. Negative for palpitations and leg swelling.  Gastrointestinal: Positive for abdominal pain. Negative for nausea, vomiting, diarrhea and constipation.  Genitourinary: Negative for dysuria and difficulty urinating.  Neurological: Negative for headaches.  All other systems reviewed and are negative.    Allergies  Avelox; Iodine; and Clonazepam  Home Medications   Current Outpatient Rx  Name  Route  Sig  Dispense  Refill  . clonazePAM (KLONOPIN) 2 MG tablet   Oral   Take 2 mg by mouth 2 (two) times daily as needed for anxiety.         . Cyanocobalamin (B-12 PO)   Oral   Take by mouth.         . ferrous sulfate 325 (65 FE) MG tablet   Oral   Take 325 mg by mouth daily with breakfast.         . lansoprazole (PREVACID) 15 MG capsule   Oral   Take 15 mg by mouth daily.         Marland Kitchen albuterol (PROVENTIL HFA;VENTOLIN HFA) 108 (90  BASE) MCG/ACT inhaler   Inhalation   Inhale 2 puffs into the lungs every 4 (four) hours as needed for wheezing.   6.7 g   0   . aspirin 81 MG tablet   Oral   Take 81 mg by mouth daily.           . benzonatate (TESSALON) 100 MG capsule   Oral   Take 2 capsules (200 mg total) by mouth 2 (two) times daily as needed for cough.   20 capsule   0   . diazepam (VALIUM) 5 MG tablet   Oral   Take 5 mg by mouth every 8 (eight) hours as needed.           . fish oil-omega-3 fatty acids 1000 MG capsule   Oral   Take 2 g by mouth daily.           Marland Kitchen FLUoxetine (PROZAC) 10 MG tablet   Oral   Take 10 mg by mouth daily.         Marland Kitchen guaiFENesin (ROBITUSSIN) 100 MG/5ML liquid   Oral   Take 5-10 mLs (100-200 mg total) by mouth every 4 (four) hours as needed for cough.   60 mL   0    . mirtazapine (REMERON) 30 MG tablet   Oral   Take 15 mg by mouth at bedtime.          . pantoprazole (PROTONIX) 20 MG tablet   Oral   Take 20 mg by mouth 2 (two) times daily.         . pravastatin (PRAVACHOL) 20 MG tablet   Oral   Take by mouth daily.           . predniSONE (DELTASONE) 20 MG tablet   Oral   Take 2 tablets (40 mg total) by mouth daily.   10 tablet   0   . propranolol (INDERAL) 10 MG tablet   Oral   Take 20 mg by mouth 3 (three) times daily.          Marland Kitchen zolpidem (AMBIEN) 10 MG tablet   Oral   Take 10 mg by mouth at bedtime as needed.            BP 103/71  Pulse 90  Temp(Src) 97.9 F (36.6 C) (Oral)  Resp 20  Ht 5\' 2"  (1.575 m)  Wt 139 lb (63.05 kg)  BMI 25.42 kg/m2  SpO2 99%  Physical Exam  Nursing note and vitals reviewed. Constitutional: She is oriented to person, place, and time. She appears well-developed and well-nourished.  Non-toxic appearance. No distress.  HENT:  Head: Normocephalic and atraumatic.  Left Ear: Tympanic membrane normal.  Eyes: Conjunctivae, EOM and lids are normal. Pupils are equal, round, and reactive to light.  Neck: Normal range of motion. Neck supple. No tracheal deviation present. No mass present.  Cardiovascular: Normal rate, regular rhythm and normal heart sounds.  Exam reveals no gallop.   No murmur heard. Pulmonary/Chest: Effort normal and breath sounds normal. No stridor. No respiratory distress. She has no decreased breath sounds. She has no wheezes. She has no rhonchi. She has no rales.  Abdominal: Soft. Normal appearance and bowel sounds are normal. There is no tenderness. There is no rebound, no guarding and no CVA tenderness.  A lot of gas throughout.  Musculoskeletal: Normal range of motion. She exhibits no edema and no tenderness.  Neurological: She is alert and oriented to person, place, and time. She has normal strength.  No cranial nerve deficit or sensory deficit. GCS eye subscore is 4. GCS  verbal subscore is 5. GCS motor subscore is 6.  Skin: Skin is warm and dry. No abrasion and no rash noted.  Psychiatric: She has a normal mood and affect. Her speech is normal and behavior is normal.    ED Course  Procedures (including critical care time)  DIAGNOSTIC STUDIES: Oxygen Saturation is 99% on RA, normal by my interpretation.    COORDINATION OF CARE: 12:18 AM-Discussed treatment plan which includes medication and xray with pt at bedside and pt agreed to plan.   Labs Reviewed  URINALYSIS, ROUTINE W REFLEX MICROSCOPIC - Abnormal; Notable for the following:    Specific Gravity, Urine 1.004 (*)    Hgb urine dipstick TRACE (*)    Leukocytes, UA TRACE (*)    All other components within normal limits  URINE MICROSCOPIC-ADD ON - Abnormal; Notable for the following:    Bacteria, UA FEW (*)    All other components within normal limits  URINE CULTURE  CBC WITH DIFFERENTIAL  COMPREHENSIVE METABOLIC PANEL  TROPONIN I  LIPASE, BLOOD   No results found. No diagnosis found.  MDM  Symptoms consistent with gas likely medication effect of changing from protonix to prevacid as started 1 day after in the setting of ongoing symptoms > 8 hours one negative ekg and troponin excludes ACS.  No indication for advanced imaging symptoms resolved post GI cocktail will change back to protonix.     Date: 12/08/2012  Rate: 95  Rhythm: normal sinus rhythm  QRS Axis: normal  Intervals: normal  ST/T Wave abnormalities: normal  Conduction Disutrbances: none  Narrative Interpretation: unremarkable        I personally performed the services described in this documentation, which was scribed in my presence. The recorded information has been reviewed and is accurate.    Jasmine Awe, MD 12/08/12 [phone]

## 2012-12-07 NOTE — ED Notes (Signed)
EMT John gave EKG printout to  MD Freida Busman

## 2012-12-07 NOTE — ED Notes (Signed)
Pt c/o abd pain that radiates up into chest. +diaphoresis and chills at times. Abd cramping which "leads to nausea". EKG being done at triage.

## 2012-12-08 ENCOUNTER — Emergency Department (HOSPITAL_BASED_OUTPATIENT_CLINIC_OR_DEPARTMENT_OTHER): Payer: No Typology Code available for payment source

## 2012-12-08 LAB — CBC WITH DIFFERENTIAL/PLATELET
Basophils Absolute: 0 10*3/uL (ref 0.0–0.1)
Eosinophils Absolute: 0.1 10*3/uL (ref 0.0–0.7)
Eosinophils Relative: 3 % (ref 0–5)
MCH: 28.6 pg (ref 26.0–34.0)
MCV: 82 fL (ref 78.0–100.0)
Neutrophils Relative %: 63 % (ref 43–77)
Platelets: 138 10*3/uL — ABNORMAL LOW (ref 150–400)
RDW: 14.6 % (ref 11.5–15.5)
WBC: 4.8 10*3/uL (ref 4.0–10.5)

## 2012-12-08 LAB — COMPREHENSIVE METABOLIC PANEL
ALT: 28 U/L (ref 0–35)
AST: 18 U/L (ref 0–37)
Albumin: 4.1 g/dL (ref 3.5–5.2)
Calcium: 9.5 mg/dL (ref 8.4–10.5)
Sodium: 140 mEq/L (ref 135–145)
Total Protein: 6.9 g/dL (ref 6.0–8.3)

## 2012-12-08 MED ORDER — PANTOPRAZOLE SODIUM 20 MG PO TBEC: 40.0000 mg | DELAYED_RELEASE_TABLET | Freq: Every day | ORAL | Status: DC

## 2012-12-08 MED ORDER — SUCRALFATE 1 GM/10ML PO SUSP: 1.0000 g | Freq: Four times a day (QID) | ORAL | Status: DC

## 2012-12-08 MED ORDER — GI COCKTAIL ~~LOC~~
30.0000 mL | Freq: Once | ORAL | Status: AC
  Administered 2012-12-08: 30 mL via ORAL
  Filled 2012-12-08: qty 30

## 2012-12-08 NOTE — ED Notes (Signed)
Patient approached nursing station inquiring how much longer until D/C. Reports relief of pain after GI cocktail. States feeling better & ready to go home. Will notify MD.

## 2012-12-08 NOTE — ED Notes (Signed)
Patient transported to X-ray via stretcher per tech. 

## 2012-12-08 NOTE — ED Notes (Signed)
Patient transported to X-ray via stretcher 

## 2012-12-08 NOTE — ED Notes (Signed)
MD at bedside. 

## 2012-12-09 ENCOUNTER — Other Ambulatory Visit (HOSPITAL_BASED_OUTPATIENT_CLINIC_OR_DEPARTMENT_OTHER): Payer: Self-pay | Admitting: Physician Assistant

## 2012-12-09 DIAGNOSIS — Z1231 Encounter for screening mammogram for malignant neoplasm of breast: Secondary | ICD-10-CM

## 2012-12-09 LAB — URINE CULTURE
Colony Count: NO GROWTH
Culture: NO GROWTH

## 2012-12-11 ENCOUNTER — Ambulatory Visit (HOSPITAL_BASED_OUTPATIENT_CLINIC_OR_DEPARTMENT_OTHER)
Admission: RE | Admit: 2012-12-11 | Discharge: 2012-12-11 | Disposition: A | Discharge status: Home / Self Care | Payer: No Typology Code available for payment source | Source: Ambulatory Visit | Attending: Physician Assistant | Admitting: Physician Assistant

## 2012-12-11 DIAGNOSIS — Z1231 Encounter for screening mammogram for malignant neoplasm of breast: Secondary | ICD-10-CM

## 2013-01-26 ENCOUNTER — Emergency Department (HOSPITAL_BASED_OUTPATIENT_CLINIC_OR_DEPARTMENT_OTHER)
Admission: EM | Admit: 2013-01-26 | Discharge: 2013-01-26 | Disposition: A | Discharge status: Home / Self Care | Payer: Self-pay | Attending: Emergency Medicine | Admitting: Emergency Medicine

## 2013-01-26 ENCOUNTER — Encounter (HOSPITAL_BASED_OUTPATIENT_CLINIC_OR_DEPARTMENT_OTHER): Payer: Self-pay

## 2013-01-26 ENCOUNTER — Emergency Department (HOSPITAL_BASED_OUTPATIENT_CLINIC_OR_DEPARTMENT_OTHER): Payer: Self-pay

## 2013-01-26 DIAGNOSIS — Z8543 Personal history of malignant neoplasm of ovary: Secondary | ICD-10-CM | POA: Insufficient documentation

## 2013-01-26 DIAGNOSIS — F329 Major depressive disorder, single episode, unspecified: Secondary | ICD-10-CM | POA: Insufficient documentation

## 2013-01-26 DIAGNOSIS — R109 Unspecified abdominal pain: Secondary | ICD-10-CM | POA: Insufficient documentation

## 2013-01-26 DIAGNOSIS — Z7982 Long term (current) use of aspirin: Secondary | ICD-10-CM | POA: Insufficient documentation

## 2013-01-26 DIAGNOSIS — Z79899 Other long term (current) drug therapy: Secondary | ICD-10-CM | POA: Insufficient documentation

## 2013-01-26 DIAGNOSIS — F3289 Other specified depressive episodes: Secondary | ICD-10-CM | POA: Insufficient documentation

## 2013-01-26 DIAGNOSIS — F17200 Nicotine dependence, unspecified, uncomplicated: Secondary | ICD-10-CM | POA: Insufficient documentation

## 2013-01-26 DIAGNOSIS — F411 Generalized anxiety disorder: Secondary | ICD-10-CM | POA: Insufficient documentation

## 2013-01-26 DIAGNOSIS — Z8679 Personal history of other diseases of the circulatory system: Secondary | ICD-10-CM | POA: Insufficient documentation

## 2013-01-26 LAB — URINALYSIS, ROUTINE W REFLEX MICROSCOPIC
Glucose, UA: NEGATIVE mg/dL
Protein, ur: NEGATIVE mg/dL
Specific Gravity, Urine: 1.004 — ABNORMAL LOW (ref 1.005–1.030)
pH: 6.5 (ref 5.0–8.0)

## 2013-01-26 LAB — COMPREHENSIVE METABOLIC PANEL
ALT: 26 U/L (ref 0–35)
AST: 19 U/L (ref 0–37)
CO2: 27 mEq/L (ref 19–32)
Calcium: 9.7 mg/dL (ref 8.4–10.5)
Chloride: 104 mEq/L (ref 96–112)
GFR calc non Af Amer: 64 mL/min — ABNORMAL LOW (ref >90)
Sodium: 141 mEq/L (ref 135–145)

## 2013-01-26 LAB — URINE MICROSCOPIC-ADD ON

## 2013-01-26 LAB — CBC WITH DIFFERENTIAL/PLATELET
Basophils Relative: 1 % (ref 0–1)
Eosinophils Absolute: 0.1 10*3/uL (ref 0.0–0.7)
Eosinophils Relative: 3 % (ref 0–5)
Hemoglobin: 12.4 g/dL (ref 12.0–15.0)
MCH: 28.4 pg (ref 26.0–34.0)
MCHC: 34.2 g/dL (ref 30.0–36.0)
Monocytes Absolute: 0.5 10*3/uL (ref 0.1–1.0)
Monocytes Relative: 10 % (ref 3–12)
Neutrophils Relative %: 65 % (ref 43–77)

## 2013-01-26 MED ORDER — IOHEXOL 300 MG/ML  SOLN
100.0000 mL | Freq: Once | INTRAMUSCULAR | Status: AC | PRN
  Administered 2013-01-26: 100 mL via INTRAVENOUS

## 2013-01-26 MED ORDER — POTASSIUM CHLORIDE CRYS ER 20 MEQ PO TBCR
40.0000 meq | EXTENDED_RELEASE_TABLET | Freq: Once | ORAL | Status: AC
  Administered 2013-01-26: 40 meq via ORAL
  Filled 2013-01-26: qty 2

## 2013-01-26 MED ORDER — IOHEXOL 300 MG/ML  SOLN
50.0000 mL | Freq: Once | INTRAMUSCULAR | Status: AC | PRN
  Administered 2013-01-26: 50 mL via ORAL

## 2013-01-26 MED ORDER — MORPHINE SULFATE 4 MG/ML IJ SOLN
4.0000 mg | Freq: Once | INTRAMUSCULAR | Status: AC
  Administered 2013-01-26: 4 mg via INTRAVENOUS
  Filled 2013-01-26: qty 1

## 2013-01-26 NOTE — ED Notes (Signed)
Patient here with abdominal pain that started last pm, reports that her pain is generalized now all over abdomen with pain that is worse after urination. Denies nausea and vomiting

## 2013-01-26 NOTE — ED Provider Notes (Signed)
CSN: [id number]     Arrival date & time 01/26/13  0150 History     First MD Initiated Contact with Patient 01/26/13 0204     Chief Complaint  Patient presents with  . Abdominal Pain   (Consider location/radiation/quality/duration/timing/severity/associated sxs/prior Treatment) HPI  Complains of diffuse abdominal pain starting at infraumbilical area radiating to epigastric area onset 01/25/2013, progressively worsening. Treated with protonix , without relief. Last bowel movement yesterday, normal no nausea or vomiting. Pain is not affected by bleeding. Pain is worse with changing positions improved with remaining still. Has not had similar pain before. Sharp and burning in quality. No other associated symptoms. Last bowel movement yesterday, normal. No anorexia no other associated symptoms Past Medical History  Diagnosis Date  . Ovarian cancer   . Anxiety   . Rapid heart rate   . Migraine   . Depression   . Stress incontinence    Past Surgical History  Procedure Laterality Date  . Ovary surgery    . Cholecystectomy    . Abdominal hysterectomy    . Mouth surgery      stone  . Bladder tack    . Foot surgery     No family history on file. History  Substance Use Topics  . Smoking status: Current Some Day Smoker -- 1.00 packs/day for 5 years    Types: Cigarettes  . Smokeless tobacco: Not on file  . Alcohol Use: No   OB History   Grav Para Term Preterm Abortions TAB SAB Ect Mult Living                 Review of Systems  Constitutional: Negative.   HENT: Negative.   Respiratory: Negative.   Cardiovascular: Negative.   Gastrointestinal: Positive for abdominal pain.  Musculoskeletal: Negative.   Skin: Negative.   Neurological: Negative.   Psychiatric/Behavioral: Negative.   All other systems reviewed and are negative.    Allergies  Avelox; Iodine; and Clonazepam  Home Medications   Current Outpatient Rx  Name  Route  Sig  Dispense  Refill  . albuterol  (PROVENTIL HFA;VENTOLIN HFA) 108 (90 BASE) MCG/ACT inhaler   Inhalation   Inhale 2 puffs into the lungs every 4 (four) hours as needed for wheezing.   6.7 g   0   . aspirin 81 MG tablet   Oral   Take 81 mg by mouth daily.           . benzonatate (TESSALON) 100 MG capsule   Oral   Take 2 capsules (200 mg total) by mouth 2 (two) times daily as needed for cough.   20 capsule   0   . clonazePAM (KLONOPIN) 2 MG tablet   Oral   Take 2 mg by mouth 2 (two) times daily as needed for anxiety.         . Cyanocobalamin (B-12 PO)   Oral   Take by mouth.         . diazepam (VALIUM) 5 MG tablet   Oral   Take 5 mg by mouth every 8 (eight) hours as needed.           . ferrous sulfate 325 (65 FE) MG tablet   Oral   Take 325 mg by mouth daily with breakfast.         . fish oil-omega-3 fatty acids 1000 MG capsule   Oral   Take 2 g by mouth daily.           Marland Kitchen  FLUoxetine (PROZAC) 10 MG tablet   Oral   Take 10 mg by mouth daily.         Marland Kitchen guaiFENesin (ROBITUSSIN) 100 MG/5ML liquid   Oral   Take 5-10 mLs (100-200 mg total) by mouth every 4 (four) hours as needed for cough.   60 mL   0   . lansoprazole (PREVACID) 15 MG capsule   Oral   Take 15 mg by mouth daily.         . mirtazapine (REMERON) 30 MG tablet   Oral   Take 15 mg by mouth at bedtime.          . pantoprazole (PROTONIX) 20 MG tablet   Oral   Take 20 mg by mouth 2 (two) times daily.         . pantoprazole (PROTONIX) 20 MG tablet   Oral   Take 2 tablets (40 mg total) by mouth daily.   30 tablet   0   . pravastatin (PRAVACHOL) 20 MG tablet   Oral   Take by mouth daily.           . predniSONE (DELTASONE) 20 MG tablet   Oral   Take 2 tablets (40 mg total) by mouth daily.   10 tablet   0   . propranolol (INDERAL) 10 MG tablet   Oral   Take 20 mg by mouth 3 (three) times daily.          . sucralfate (CARAFATE) 1 GM/10ML suspension   Oral   Take 10 mLs (1 g total) by mouth 4 (four)  times daily.   420 mL   0   . zolpidem (AMBIEN) 10 MG tablet   Oral   Take 10 mg by mouth at bedtime as needed.            BP 117/74  Pulse 88  Temp(Src) 97.6 F (36.4 C) (Oral)  Resp 18  Wt 154 lb (69.854 kg)  BMI 28.16 kg/m2  SpO2 98% Physical Exam  Nursing note and vitals reviewed. Constitutional: She appears well-developed and well-nourished. No distress.  HENT:  Head: Normocephalic and atraumatic.  Eyes: Conjunctivae are normal. Pupils are equal, round, and reactive to light.  Neck: Neck supple. No tracheal deviation present. No thyromegaly present.  Cardiovascular: Normal rate and regular rhythm.   No murmur heard. Pulmonary/Chest: Effort normal and breath sounds normal.  Abdominal: Soft. Bowel sounds are normal. She exhibits no distension and no mass. There is tenderness. There is no rebound and no guarding.  Diffuse tenderness  Musculoskeletal: Normal range of motion. She exhibits no edema and no tenderness.  Neurological: She is alert. Coordination normal.  Skin: Skin is warm and dry. No rash noted.  Psychiatric: She has a normal mood and affect.    ED Course  2:45 AM pain improved after treatment with intravenous morphine. Procedures (including critical care time)  Labs Reviewed  URINALYSIS, ROUTINE W REFLEX MICROSCOPIC   No results found. No diagnosis found. 4:35 AM patient remained comfortable. Results for orders placed during the hospital encounter of 01/26/13  URINALYSIS, ROUTINE W REFLEX MICROSCOPIC      Result Value Range   Color, Urine YELLOW  YELLOW   APPearance CLEAR  CLEAR   Specific Gravity, Urine 1.004 (*) 1.005 - 1.030   pH 6.5  5.0 - 8.0   Glucose, UA NEGATIVE  NEGATIVE mg/dL   Hgb urine dipstick NEGATIVE  NEGATIVE   Bilirubin Urine NEGATIVE  NEGATIVE   Ketones, ur  NEGATIVE  NEGATIVE mg/dL   Protein, ur NEGATIVE  NEGATIVE mg/dL   Urobilinogen, UA 0.2  0.0 - 1.0 mg/dL   Nitrite NEGATIVE  NEGATIVE   Leukocytes, UA SMALL (*) NEGATIVE   COMPREHENSIVE METABOLIC PANEL      Result Value Range   Sodium 141  135 - 145 mEq/L   Potassium 3.3 (*) 3.5 - 5.1 mEq/L   Chloride 104  96 - 112 mEq/L   CO2 27  19 - 32 mEq/L   Glucose, Bld 91  70 - 99 mg/dL   BUN 8  6 - 23 mg/dL   Creatinine, Ser 1.61  0.50 - 1.10 mg/dL   Calcium 9.7  8.4 - 09.6 mg/dL   Total Protein 6.6  6.0 - 8.3 g/dL   Albumin 3.8  3.5 - 5.2 g/dL   AST 19  0 - 37 U/L   ALT 26  0 - 35 U/L   Alkaline Phosphatase 79  39 - 117 U/L   Total Bilirubin 0.2 (*) 0.3 - 1.2 mg/dL   GFR calc non Af Amer 64 (*) >90 mL/min   GFR calc Af Amer 74 (*) >90 mL/min  CBC WITH DIFFERENTIAL      Result Value Range   WBC 5.0  4.0 - 10.5 K/uL   RBC 4.36  3.87 - 5.11 MIL/uL   Hemoglobin 12.4  12.0 - 15.0 g/dL   HCT 04.5  40.9 - 81.1 %   MCV 83.3  78.0 - 100.0 fL   MCH 28.4  26.0 - 34.0 pg   MCHC 34.2  30.0 - 36.0 g/dL   RDW 91.4  78.2 - 95.6 %   Platelets 151  150 - 400 K/uL   Neutrophils Relative % 65  43 - 77 %   Neutro Abs 3.3  1.7 - 7.7 K/uL   Lymphocytes Relative 22  12 - 46 %   Lymphs Abs 1.1  0.7 - 4.0 K/uL   Monocytes Relative 10  3 - 12 %   Monocytes Absolute 0.5  0.1 - 1.0 K/uL   Eosinophils Relative 3  0 - 5 %   Eosinophils Absolute 0.1  0.0 - 0.7 K/uL   Basophils Relative 1  0 - 1 %   Basophils Absolute 0.0  0.0 - 0.1 K/uL  URINE MICROSCOPIC-ADD ON      Result Value Range   Squamous Epithelial / LPF FEW (*) RARE   WBC, UA 3-6  <3 WBC/hpf   RBC / HPF 0-2  <3 RBC/hpf   Bacteria, UA FEW (*) RARE   Ct Abdomen Pelvis W Contrast  01/26/2013   *RADIOLOGY REPORT*  Clinical Data: Abdominal pain starting last night.  Pain is worse after urination.  CT ABDOMEN AND PELVIS WITH CONTRAST  Technique:  Multidetector CT imaging of the abdomen and pelvis was performed following the standard protocol during bolus administration of intravenous contrast.  Contrast:  100 ml Omnipaque-300  Comparison: 10/07/2011  Findings: Two small nodules demonstrated in the right lower lung  measuring less than 4 mm diameter.  These are likely benign.  This area was not included on the previous study for comparison.  Diffuse fatty infiltration of the liver.  Surgical absence of the gallbladder.  Fatty atrophy of the pancreas.  The spleen, adrenal glands, abdominal aorta, and inferior vena cava are unremarkable. Stable parapelvic cyst in the right kidney.  No retroperitoneal lymphadenopathy.  The stomach and small bowel are unremarkable. Stool filled colon without abnormal distension.  No free  air or free fluid in the abdomen.  Pelvis:  There appears to be surgically absent uterus.  No abnormal adnexal masses.  Bladder wall is not thickened.  Appendix is normal.  No free or loculated pelvic fluid collections.  Pelvic lymph nodes are not pathologically enlarged.  No inflammatory changes involving the sigmoid colon. The normal alignment of the lumbar vertebrae.  No destructive bone lesions appreciated.  IMPRESSION: No acute process demonstrated in the abdomen or pelvis.  Fatty infiltration of the liver and pancreas.  Surgical absence of the gallbladder.  Parapelvic cyst in the right kidney.  Indeterminate 4 mm nodules in the right lung base which are likely to be benign in a low risk patient.   Original Report Authenticated By: Burman Nieves, M.D.    MDM  Etiology of pain is unclear.  I discussed lung nodules and renal cyst with patient. Spent 5 minutes counseling patient on smoking cessation. I suggested she asked for primary care physician to order a chest x-ray in 3-6 months. Tylenol as directed for pain. She can followup with her gastroenterologist if abdominal discomfort continues. She should sleep on 2 pillows  Diagnosis #1 nonspecific abdominal pain #2 hypokalemia #3 lung nodules #4 renal cyst #5 tobacco abuse   Doug Sou, MD 01/26/13 [id number]

## 2013-01-27 LAB — URINE CULTURE

## 2013-02-18 ENCOUNTER — Emergency Department (HOSPITAL_BASED_OUTPATIENT_CLINIC_OR_DEPARTMENT_OTHER): Payer: No Typology Code available for payment source

## 2013-02-18 ENCOUNTER — Emergency Department (HOSPITAL_BASED_OUTPATIENT_CLINIC_OR_DEPARTMENT_OTHER)
Admission: EM | Admit: 2013-02-18 | Discharge: 2013-02-18 | Disposition: A | Discharge status: Home / Self Care | Payer: No Typology Code available for payment source | Attending: Emergency Medicine | Admitting: Emergency Medicine

## 2013-02-18 ENCOUNTER — Encounter (HOSPITAL_BASED_OUTPATIENT_CLINIC_OR_DEPARTMENT_OTHER): Payer: Self-pay | Admitting: *Deleted

## 2013-02-18 DIAGNOSIS — Z8742 Personal history of other diseases of the female genital tract: Secondary | ICD-10-CM | POA: Insufficient documentation

## 2013-02-18 DIAGNOSIS — Z7982 Long term (current) use of aspirin: Secondary | ICD-10-CM | POA: Insufficient documentation

## 2013-02-18 DIAGNOSIS — IMO0001 Reserved for inherently not codable concepts without codable children: Secondary | ICD-10-CM | POA: Insufficient documentation

## 2013-02-18 DIAGNOSIS — R1310 Dysphagia, unspecified: Secondary | ICD-10-CM | POA: Insufficient documentation

## 2013-02-18 DIAGNOSIS — F3289 Other specified depressive episodes: Secondary | ICD-10-CM | POA: Insufficient documentation

## 2013-02-18 DIAGNOSIS — J029 Acute pharyngitis, unspecified: Secondary | ICD-10-CM | POA: Insufficient documentation

## 2013-02-18 DIAGNOSIS — R000 Tachycardia, unspecified: Secondary | ICD-10-CM | POA: Insufficient documentation

## 2013-02-18 DIAGNOSIS — F411 Generalized anxiety disorder: Secondary | ICD-10-CM | POA: Insufficient documentation

## 2013-02-18 DIAGNOSIS — G43909 Migraine, unspecified, not intractable, without status migrainosus: Secondary | ICD-10-CM | POA: Insufficient documentation

## 2013-02-18 DIAGNOSIS — Z8543 Personal history of malignant neoplasm of ovary: Secondary | ICD-10-CM | POA: Insufficient documentation

## 2013-02-18 DIAGNOSIS — F329 Major depressive disorder, single episode, unspecified: Secondary | ICD-10-CM | POA: Insufficient documentation

## 2013-02-18 DIAGNOSIS — J069 Acute upper respiratory infection, unspecified: Secondary | ICD-10-CM

## 2013-02-18 DIAGNOSIS — F17200 Nicotine dependence, unspecified, uncomplicated: Secondary | ICD-10-CM | POA: Insufficient documentation

## 2013-02-18 DIAGNOSIS — Z79899 Other long term (current) drug therapy: Secondary | ICD-10-CM | POA: Insufficient documentation

## 2013-02-18 LAB — RAPID STREP SCREEN (MED CTR MEBANE ONLY): Streptococcus, Group A Screen (Direct): NEGATIVE

## 2013-02-18 MED ORDER — BENZONATATE 100 MG PO CAPS: 100.0000 mg | ORAL_CAPSULE | Freq: Three times a day (TID) | ORAL | Status: DC

## 2013-02-18 NOTE — ED Notes (Signed)
Sore throat and cough with green sputum. Symptoms since last night. No resp distress.

## 2013-02-18 NOTE — ED Provider Notes (Signed)
CSN: [id number]     Arrival date & time 02/18/13  1256 History   First MD Initiated Contact with Patient 02/18/13 1458     Chief Complaint  Patient presents with  . Sore Throat  . Cough   (Consider location/radiation/quality/duration/timing/severity/associated sxs/prior Treatment) Patient is a 52 y.o. female presenting with pharyngitis and URI. The history is provided by the patient. No language interpreter was used.  Sore Throat This is a new problem. The current episode started 12 to 24 hours ago. The problem occurs constantly. The problem has not changed since onset.Pertinent negatives include no chest pain, no abdominal pain, no headaches and no shortness of breath. The symptoms are aggravated by eating and swallowing. Nothing relieves the symptoms. She has tried nothing for the symptoms. The treatment provided no relief.  URI Presenting symptoms: congestion, cough, rhinorrhea and sore throat   Presenting symptoms: no fatigue and no fever   Congestion:    Location:  Nasal   Interferes with sleep: no     Interferes with eating/drinking: no   Cough:    Cough characteristics:  Productive   Sputum characteristics:  Green   Severity:  Moderate   Onset quality:  Sudden   Duration:  12 hours   Timing:  Intermittent   Progression:  Unchanged   Chronicity:  New Severity:  Moderate Onset quality:  Sudden Duration:  12 hours Timing:  Constant Progression:  Unchanged Chronicity:  New Relieved by:  Nothing Worsened by:  Nothing tried Ineffective treatments:  None tried Associated symptoms: myalgias   Associated symptoms: no arthralgias, no headaches, no neck pain, no sinus pain, no sneezing, no swollen glands and no wheezing   Risk factors: sick contacts (mother with similar symptoms)   Risk factors: no immunosuppression     Past Medical History  Diagnosis Date  . Ovarian cancer   . Anxiety   . Rapid heart rate   . Migraine   . Depression   . Stress incontinence    Past  Surgical History  Procedure Laterality Date  . Ovary surgery    . Cholecystectomy    . Abdominal hysterectomy    . Mouth surgery      stone  . Bladder tack    . Foot surgery     No family history on file. History  Substance Use Topics  . Smoking status: Current Some Day Smoker -- 1.00 packs/day for 5 years    Types: Cigarettes  . Smokeless tobacco: Not on file  . Alcohol Use: No   OB History   Grav Para Term Preterm Abortions TAB SAB Ect Mult Living                 Review of Systems  Constitutional: Positive for activity change and appetite change. Negative for fever, chills, diaphoresis and fatigue.  HENT: Positive for congestion, sore throat and rhinorrhea. Negative for facial swelling, sneezing, neck pain and neck stiffness.   Eyes: Negative for photophobia and discharge.  Respiratory: Positive for cough. Negative for chest tightness, shortness of breath and wheezing.   Cardiovascular: Negative for chest pain, palpitations and leg swelling.  Gastrointestinal: Negative for nausea, vomiting, abdominal pain and diarrhea.  Endocrine: Negative for polydipsia and polyuria.  Genitourinary: Negative for dysuria, frequency, difficulty urinating and pelvic pain.  Musculoskeletal: Positive for myalgias. Negative for back pain and arthralgias.  Skin: Negative for color change and wound.  Allergic/Immunologic: Negative for immunocompromised state.  Neurological: Negative for facial asymmetry, weakness, numbness and headaches.  Hematological:  Does not bruise/bleed easily.  Psychiatric/Behavioral: Negative for confusion and agitation.    Allergies  Avelox and Iodine  Home Medications   Current Outpatient Rx  Name  Route  Sig  Dispense  Refill  . Umeclidinium-Vilanterol (ANORO ELLIPTA IN)   Inhalation   Inhale into the lungs.         Marland Kitchen albuterol (PROVENTIL HFA;VENTOLIN HFA) 108 (90 BASE) MCG/ACT inhaler   Inhalation   Inhale 2 puffs into the lungs every 4 (four) hours as  needed for wheezing.   6.7 g   0   . aspirin 81 MG tablet   Oral   Take 81 mg by mouth daily.           . benzonatate (TESSALON) 100 MG capsule   Oral   Take 2 capsules (200 mg total) by mouth 2 (two) times daily as needed for cough.   20 capsule   0   . benzonatate (TESSALON) 100 MG capsule   Oral   Take 1 capsule (100 mg total) by mouth every 8 (eight) hours.   21 capsule   0   . clonazePAM (KLONOPIN) 2 MG tablet   Oral   Take 2 mg by mouth 2 (two) times daily as needed for anxiety.         . Cyanocobalamin (B-12 PO)   Oral   Take by mouth.         . diazepam (VALIUM) 5 MG tablet   Oral   Take 5 mg by mouth every 8 (eight) hours as needed.           . ferrous sulfate 325 (65 FE) MG tablet   Oral   Take 325 mg by mouth daily with breakfast.         . fish oil-omega-3 fatty acids 1000 MG capsule   Oral   Take 2 g by mouth daily.           Marland Kitchen FLUoxetine (PROZAC) 10 MG tablet   Oral   Take 10 mg by mouth daily.         Marland Kitchen guaiFENesin (ROBITUSSIN) 100 MG/5ML liquid   Oral   Take 5-10 mLs (100-200 mg total) by mouth every 4 (four) hours as needed for cough.   60 mL   0   . lansoprazole (PREVACID) 15 MG capsule   Oral   Take 15 mg by mouth daily.         . mirtazapine (REMERON) 30 MG tablet   Oral   Take 15 mg by mouth at bedtime.          . pantoprazole (PROTONIX) 20 MG tablet   Oral   Take 20 mg by mouth 2 (two) times daily.         . pantoprazole (PROTONIX) 20 MG tablet   Oral   Take 2 tablets (40 mg total) by mouth daily.   30 tablet   0   . pravastatin (PRAVACHOL) 20 MG tablet   Oral   Take by mouth daily.           . predniSONE (DELTASONE) 20 MG tablet   Oral   Take 2 tablets (40 mg total) by mouth daily.   10 tablet   0   . propranolol (INDERAL) 10 MG tablet   Oral   Take 20 mg by mouth 3 (three) times daily.          . sucralfate (CARAFATE) 1 GM/10ML suspension   Oral   Take  10 mLs (1 g total) by mouth 4  (four) times daily.   420 mL   0   . zolpidem (AMBIEN) 10 MG tablet   Oral   Take 10 mg by mouth at bedtime as needed.            BP 108/64  Pulse 84  Temp(Src) 98.5 F (36.9 C) (Oral)  Resp 20  Ht 5\' 2"  (1.575 m)  Wt 148 lb (67.132 kg)  BMI 27.06 kg/m2  SpO2 96% Physical Exam  Constitutional: She is oriented to person, place, and time. She appears well-developed and well-nourished. No distress.  HENT:  Head: Normocephalic and atraumatic.  Mouth/Throat: Posterior oropharyngeal erythema present. No oropharyngeal exudate, posterior oropharyngeal edema or tonsillar abscesses.  Eyes: Pupils are equal, round, and reactive to light.  Neck: Normal range of motion. Neck supple.  Cardiovascular: Normal rate, regular rhythm and normal heart sounds.  Exam reveals no gallop and no friction rub.   No murmur heard. Pulmonary/Chest: Effort normal. No respiratory distress. She has no wheezes. She has rales (mild wheeze LUL that cleared w/ coughing).  Abdominal: Soft. Bowel sounds are normal. She exhibits no distension and no mass. There is no tenderness. There is no rebound and no guarding.  Musculoskeletal: Normal range of motion. She exhibits no edema and no tenderness.  Neurological: She is alert and oriented to person, place, and time.  Skin: Skin is warm and dry.  Psychiatric: She has a normal mood and affect.    ED Course  Procedures (including critical care time) Labs Review Labs Reviewed  RAPID STREP SCREEN  CULTURE, GROUP A STREP   Imaging Review Dg Chest 2 View  02/18/2013   CLINICAL DATA:  Cough and congestion for 2 days. History of ovarian cancer.  EXAM: CHEST  2 VIEW  COMPARISON:  PA and lateral chest 05/30/2012.  FINDINGS: Mild linear atelectasis is seen in left lung base. Lungs otherwise appear clear. Heart size is normal. No pneumothorax or pleural fluid. No focal bony abnormality.  IMPRESSION: No active cardiopulmonary disease.   Electronically Signed   By: Drusilla Kanner M.D.   On: 02/18/2013 19:16    MDM   1. Viral URI with cough    Pt is a 52 y.o. female with Pmhx as above who presents with less than 24 hrs of URI symptoms w/ cough, congestion, sore throat, ear fullness, myalgias, subjective fever & chills.  VSS, afebrile in NAD on PE.  Light wheeze heard on pulm exam that cleared w/ coughing.  Mild erythema of orophaynx w/o signs of RTA or PTA.  Rapid strep & CXR ordered from Triage and were negative.  Will d/c home w/ instrucions for supportive care w/ OCT antipyretics, cough syrup, throar lozenges for likely viral URI.  Return precautions given for new or worsening symptoms, pt can f/u with PCP.   1. Viral URI with cough         Shanna Cisco, MD 02/19/13 1215

## 2013-02-19 NOTE — ED Notes (Signed)
Paient called for return to work note,ok to give note for 3 days per Roxy Horseman.

## 2013-02-20 LAB — CULTURE, GROUP A STREP

## 2013-05-20 ENCOUNTER — Telehealth: Payer: Self-pay | Admitting: *Deleted

## 2013-05-20 ENCOUNTER — Ambulatory Visit (INDEPENDENT_AMBULATORY_CARE_PROVIDER_SITE_OTHER): Payer: BC Managed Care – PPO | Admitting: Internal Medicine

## 2013-05-20 ENCOUNTER — Encounter: Payer: Self-pay | Admitting: Internal Medicine

## 2013-05-20 VITALS — BP 110/60 | HR 85 | Ht 62.0 in | Wt 163.0 lb

## 2013-05-20 DIAGNOSIS — F411 Generalized anxiety disorder: Secondary | ICD-10-CM

## 2013-05-20 DIAGNOSIS — I471 Supraventricular tachycardia: Secondary | ICD-10-CM

## 2013-05-20 DIAGNOSIS — R000 Tachycardia, unspecified: Secondary | ICD-10-CM | POA: Insufficient documentation

## 2013-05-20 DIAGNOSIS — R0602 Shortness of breath: Secondary | ICD-10-CM

## 2013-05-20 DIAGNOSIS — R635 Abnormal weight gain: Secondary | ICD-10-CM

## 2013-05-20 DIAGNOSIS — R002 Palpitations: Secondary | ICD-10-CM

## 2013-05-20 DIAGNOSIS — E785 Hyperlipidemia, unspecified: Secondary | ICD-10-CM | POA: Insufficient documentation

## 2013-05-20 DIAGNOSIS — F419 Anxiety disorder, unspecified: Secondary | ICD-10-CM | POA: Insufficient documentation

## 2013-05-20 DIAGNOSIS — R0609 Other forms of dyspnea: Secondary | ICD-10-CM

## 2013-05-20 MED ORDER — ATENOLOL 25 MG PO TABS: 25.0000 mg | ORAL_TABLET | Freq: Every day | ORAL | Status: DC

## 2013-05-20 NOTE — Patient Instructions (Addendum)
Cut back clonazepam 2mg  once daily. STOP valium Try to not take ambien every night.  Stop propranolol.  START atenolol 25mg  once daily  Your physician has requested that you have an echocardiogram. Echocardiography is a painless test that uses sound waves to create images of your heart. It provides your doctor with information about the size and shape of your heart and how well your heart's chambers and valves are working. This procedure takes approximately one hour. There are no restrictions for this procedure.  Please schedule a follow up visit with Dr. Rennis Golden in January, after your echo.

## 2013-05-20 NOTE — Progress Notes (Signed)
OFFICE NOTE  Chief Complaint:  New patient, self-referral, weight gain, DOE, palpitations, fatigue  Primary Care Physician: Lovenia Kim, PA-C  HPI:  Sarah [name] is a 52 year old female patient whose husband is also my patient. She presents for the first time for evaluation of weight gain. She says that she's gained 8-10 pounds over the past 2 weeks and then 8-10 pounds prior to that. Despite this she denies eating more food, but has had no lower extremity swelling, orthopnea, PND or other heart failure symptoms. She does report shortness of breath however which is chronic and not necessarily associated with exertion. She has some chronic complaints of fatigue and actually appears to be on quite a few medications. She denies taking both clonazepam and diazepam, but they're both on her medicine list. Her dose of the clonazepam is 2 mg twice daily. She additionally takes Ambien 10 mg every night along with Remeron and Inderal 10 mg 3 times daily. The accommodation of all these medications can be extremely sedating and it appears that she is quite sedate today. She does report a history of SVT in the past and has been in the emergency room numerous times for this. She also tells me that she is planning to see a thyroid specialist and may have some issue with her thyroid. The reason for a number of her anti-anxiety medications is long standing anxiety and it does seem that this is associated with her palpitations. She denies any cardiac sounding chest pain.  PMHx:  Past Medical History  Diagnosis Date  . Ovarian cancer   . Anxiety   . Rapid heart rate   . Migraine   . Depression   . Stress incontinence     Past Surgical History  Procedure Laterality Date  . Ovary surgery    . Cholecystectomy    . Abdominal hysterectomy    . Mouth surgery      stone  . Bladder tack    . Foot surgery      FAMHx:  No family history on file.  SOCHx:   reports that she has been smoking  Cigarettes.  She has a 5 pack-year smoking history. She does not have any smokeless tobacco history on file. She reports that she does not drink alcohol or use illicit drugs.  ALLERGIES:  Allergies  Allergen Reactions  . Avelox [Moxifloxacin Hcl In Nacl] Palpitations  . Iodine Swelling and Other (See Comments)    Sensation of internal heat    ROS: A comprehensive review of systems was negative except for: Constitutional: positive for weight gain, fatigue Respiratory: positive for dyspnea on exertion Cardiovascular: positive for palpitations Behavioral/Psych: positive for anxiety and excessive sedation  HOME MEDS: Current Outpatient Prescriptions  Medication Sig Dispense Refill  . albuterol (PROVENTIL HFA;VENTOLIN HFA) 108 (90 BASE) MCG/ACT inhaler Inhale 2 puffs into the lungs every 4 (four) hours as needed for wheezing.  6.7 g  0  . aspirin 81 MG tablet Take 81 mg by mouth daily.        . clonazePAM (KLONOPIN) 2 MG tablet Take 2 mg by mouth daily as needed for anxiety.       . Cyanocobalamin (B-12 PO) Take by mouth.      . fish oil-omega-3 fatty acids 1000 MG capsule Take 2 g by mouth daily.        . mirtazapine (REMERON) 30 MG tablet Take 15 mg by mouth at bedtime.       . pantoprazole (PROTONIX) 20  MG tablet Take 20 mg by mouth daily.       . pravastatin (PRAVACHOL) 20 MG tablet Take by mouth daily.        Marland Kitchen Umeclidinium-Vilanterol (ANORO ELLIPTA IN) Inhale 25 mg into the lungs daily.       Marland Kitchen zolpidem (AMBIEN) 10 MG tablet Take 10 mg by mouth at bedtime as needed.        Marland Kitchen atenolol (TENORMIN) 25 MG tablet Take 1 tablet (25 mg total) by mouth daily.  30 tablet  6   No current facility-administered medications for this visit.    LABS/IMAGING: No results found for this or any previous visit (from the past 48 hour(s)). No results found.  VITALS: BP 110/60  Pulse 85  Ht 5\' 2"  (1.575 m)  Wt 163 lb (73.936 kg)  BMI 29.81 kg/m2  EXAM: General appearance: Appears drowsy, but  follows commands Neck: no carotid bruit and no JVD Lungs: diminished breath sounds bibasilar Heart: regular rate and rhythm, S1, S2 normal, no murmur, click, rub or gallop Abdomen: soft, non-tender; bowel sounds normal; no masses,  no organomegaly Extremities: extremities normal, atraumatic, no cyanosis or edema Pulses: 2+ and symmetric Skin: Skin color, texture, turgor normal. No rashes or lesions Neurologic: Mental status: Alert, oriented, thought content appropriate Cranial nerves: normal Psych: Appears somewhat oversedated  EKG: Normal sinus rhythm at 85  ASSESSMENT: 1. PSVT 2. Dyspnea on exertion 3. Marked weight gain, reportedly without dietary changes 4. Dyslipidemia 5. Excessive medical sedation  PLAN: 1.   Mrs. Collins appears to be excessively sedated today. I suspect this is due to her benzodiazepines, Remeron, Ambien and other medications that she uses for anxiety. This could be playing a role in her fatigue, probably more likely than her Inderal. I will recommend that she decrease her clonazepam to 2 mg daily as part of a scheduled taper. She should also try to take her Ambien only as needed instead of every night. I also would recommend discontinuing her propranolol and switching her to atenolol 25 mg once daily.   Will go ahead and obtain an echocardiogram to evaluate her shortness of breath and for possible cardiac cause of her ongoing weight gain, although her exam is not consistent with heart failure.  Plan to see her back in 2-3 weeks to discuss results of these studies and to reassess her wakefulness.   Chrystie Nose, MD, [facility] Attending Cardiologist [facility]  Martyn Timme C 05/20/2013, 12:59 PM

## 2013-05-20 NOTE — Telephone Encounter (Signed)
Pt stated that she just left the office and went to pick up her medication. She stated that the pharmacy's computer system was down and that they needed her Rx to be faxed back in.

## 2013-05-20 NOTE — Telephone Encounter (Signed)
Call to pharmacy and on hold for several minutes.  Call ended.  Returned call and pt verified x 2.  RN asked pt if she was told computer system is back up b/c called and on hold for a while.  Pt stated they said it was back up.  Pt informed Rx will be resent.  Pt verbalized understanding and agreed w/ plan.

## 2013-06-10 ENCOUNTER — Ambulatory Visit (HOSPITAL_COMMUNITY)
Admission: RE | Admit: 2013-06-10 | Discharge: 2013-06-10 | Disposition: A | Discharge status: Home / Self Care | Payer: BC Managed Care – PPO | Source: Ambulatory Visit | Attending: Cardiovascular Disease | Admitting: Cardiovascular Disease

## 2013-06-10 DIAGNOSIS — R002 Palpitations: Secondary | ICD-10-CM | POA: Insufficient documentation

## 2013-06-10 DIAGNOSIS — R0609 Other forms of dyspnea: Secondary | ICD-10-CM | POA: Insufficient documentation

## 2013-06-10 DIAGNOSIS — E119 Type 2 diabetes mellitus without complications: Secondary | ICD-10-CM | POA: Insufficient documentation

## 2013-06-10 DIAGNOSIS — R0989 Other specified symptoms and signs involving the circulatory and respiratory systems: Principal | ICD-10-CM | POA: Insufficient documentation

## 2013-06-10 DIAGNOSIS — R0602 Shortness of breath: Secondary | ICD-10-CM

## 2013-06-10 NOTE — Progress Notes (Signed)
2D Echo Performed 06/10/2013    Marygrace Drought, RCS

## 2013-06-12 ENCOUNTER — Encounter: Payer: Self-pay | Admitting: Internal Medicine

## 2013-06-12 ENCOUNTER — Ambulatory Visit (INDEPENDENT_AMBULATORY_CARE_PROVIDER_SITE_OTHER): Payer: BC Managed Care – PPO | Admitting: Internal Medicine

## 2013-06-12 VITALS — BP 112/78 | HR 98 | Ht 62.0 in | Wt 163.1 lb

## 2013-06-12 DIAGNOSIS — Z91199 Patient's noncompliance with other medical treatment and regimen due to unspecified reason: Secondary | ICD-10-CM

## 2013-06-12 DIAGNOSIS — R0609 Other forms of dyspnea: Secondary | ICD-10-CM

## 2013-06-12 DIAGNOSIS — I471 Supraventricular tachycardia: Secondary | ICD-10-CM

## 2013-06-12 DIAGNOSIS — Z91148 Patient's other noncompliance with medication regimen for other reason: Secondary | ICD-10-CM | POA: Insufficient documentation

## 2013-06-12 DIAGNOSIS — Z9119 Patient's noncompliance with other medical treatment and regimen: Secondary | ICD-10-CM

## 2013-06-12 DIAGNOSIS — R0989 Other specified symptoms and signs involving the circulatory and respiratory systems: Secondary | ICD-10-CM

## 2013-06-12 DIAGNOSIS — F419 Anxiety disorder, unspecified: Secondary | ICD-10-CM

## 2013-06-12 DIAGNOSIS — F411 Generalized anxiety disorder: Secondary | ICD-10-CM

## 2013-06-12 DIAGNOSIS — Z9114 Patient's other noncompliance with medication regimen: Secondary | ICD-10-CM | POA: Insufficient documentation

## 2013-06-12 NOTE — Progress Notes (Signed)
OFFICE NOTE  Chief Complaint:  New patient, self-referral, weight gain, DOE, palpitations, fatigue  Primary Care Physician: Corine Shelter, PA-C  HPI:  Sarah [name] is a 53 year old female patient whose husband is also my patient. She presents for the first time for evaluation of weight gain. She says that she's gained 8-10 pounds over the past 2 weeks and then 8-10 pounds prior to that. Despite this she denies eating more food, but has had no lower extremity swelling, orthopnea, PND or other heart failure symptoms. She does report shortness of breath however which is chronic and not necessarily associated with exertion. She has some chronic complaints of fatigue and actually appears to be on quite a few medications. She denies taking both clonazepam and diazepam, but they're both on her medicine list. Her dose of the clonazepam is 2 mg twice daily. She additionally takes Ambien 10 mg every night along with Remeron and Inderal 10 mg 3 times daily. The accommodation of all these medications can be extremely sedating and it appears that she is quite sedate today. She does report a history of SVT in the past and has been in the emergency room numerous times for this. She also tells me that she is planning to see a thyroid specialist and may have some issue with her thyroid. The reason for a number of her anti-anxiety medications is long standing anxiety and it does seem that this is associated with her palpitations. She denies any cardiac sounding chest pain.  Sarah [name] had an echocardiogram which showed a preserved EF of 55-60% and no evidence for heart failure. Her weight has remained the same and her swelling has improved. She seems to be tolerating atenolol better than her nonselective beta blocker. Also by decreasing her clonazepam and some of her other sedating medications she is remarkably more awake today and reports feeling much better.  PMHx:  Past Medical History  Diagnosis Date  .  Ovarian cancer   . Anxiety   . Rapid heart rate   . Migraine   . Depression   . Stress incontinence     Past Surgical History  Procedure Laterality Date  . Ovary surgery    . Cholecystectomy    . Abdominal hysterectomy    . Mouth surgery      stone  . Bladder tack    . Foot surgery      FAMHx:  No family history on file.  SOCHx:   reports that she has been smoking Cigarettes.  She has a 5 pack-year smoking history. She does not have any smokeless tobacco history on file. She reports that she does not drink alcohol or use illicit drugs.  ALLERGIES:  Allergies  Allergen Reactions  . Avelox [Moxifloxacin Hcl In Nacl] Palpitations  . Iodine Swelling and Other (See Comments)    Sensation of internal heat    ROS: A comprehensive review of systems was negative except for: Constitutional: positive for weight gain, fatigue Respiratory: positive for dyspnea on exertion Cardiovascular: positive for palpitations Behavioral/Psych: positive for anxiety and excessive sedation  HOME MEDS: Current Outpatient Prescriptions  Medication Sig Dispense Refill  . albuterol (PROVENTIL HFA;VENTOLIN HFA) 108 (90 BASE) MCG/ACT inhaler Inhale 2 puffs into the lungs every 4 (four) hours as needed for wheezing.  6.7 g  0  . aspirin 81 MG tablet Take 81 mg by mouth daily.        Marland Kitchen atenolol (TENORMIN) 25 MG tablet Take 1 tablet (25 mg total) by  mouth daily.  30 tablet  6  . clonazePAM (KLONOPIN) 2 MG tablet Take 2 mg by mouth daily as needed for anxiety.       . Cyanocobalamin (B-12 PO) Take by mouth.      . mirtazapine (REMERON) 30 MG tablet Take 15 mg by mouth at bedtime.       . Omega-3 Fatty Acids (FISH OIL) 1200 MG CAPS Take 1 capsule by mouth daily.      . pantoprazole (PROTONIX) 20 MG tablet Take 20 mg by mouth daily.       . pravastatin (PRAVACHOL) 20 MG tablet Take by mouth daily.        Marland Kitchen Umeclidinium-Vilanterol (ANORO ELLIPTA IN) Inhale 25 mg into the lungs daily.       Marland Kitchen zolpidem  (AMBIEN) 10 MG tablet Take 10 mg by mouth at bedtime as needed.         No current facility-administered medications for this visit.    LABS/IMAGING: No results found for this or any previous visit (from the past 48 hour(s)). No results found.  VITALS: BP 112/78  Pulse 98  Ht 5\' 2"  (1.575 m)  Wt 163 lb 1.6 oz (73.982 kg)  BMI 29.82 kg/m2  EXAM: Appears much less sedated today  EKG: deferred  ASSESSMENT: 1. PSVT - controlled on atenolol 2. Dyspnea on exertion - EF 55-60% 3. Stable weight 4. Dyslipidemia 5. Excessive medical sedation - much improved with decreases in her medicine  PLAN: 1.   Sarah [name] is feeling much better today with adjustments in her medications. She is tolerating atenolol and has had good control of her SVT. She is much less sedated off of propranolol and the reduction in her antianxiety medications seem to be helping her. She will have to be careful to avoid excess medical sedation in the future. Plan to see her back in 6 months.  Pixie Casino, MD, [facility] Attending Cardiologist [facility]  Ena Demary C 06/12/2013, 10:49 AM

## 2013-06-12 NOTE — Patient Instructions (Signed)
Your physician wants you to follow-up in: 6 months with Dr. Hilty. You will receive a reminder letter in the mail two months in advance. If you don't receive a letter, please call our office to schedule the follow-up appointment.    

## 2013-11-04 ENCOUNTER — Telehealth: Payer: Self-pay | Admitting: Internal Medicine

## 2013-11-04 NOTE — Telephone Encounter (Signed)
Patient states that she is having trouble keeping her rate under control.  Thinks her medication is not working.

## 2013-11-04 NOTE — Telephone Encounter (Signed)
Left message on voice mail.  Per Dr Debara Pickett , may take 1/2 tablet at night if needed for increase heart rate. If increase quantity of Atenolol is needed.please call office back.

## 2013-11-04 NOTE — Telephone Encounter (Signed)
Spoke to patient. She states in the last 4 weeks or so -she has been feeling tired  After 5 pm - heart rates increase low 100's(105-108). She states when she wakes up in the morning heart rate is 102. She states she takes ATENOLOL 25 mg in the morning.  Will defer to Dr Debara Pickett and contact patient

## 2013-11-04 NOTE — Telephone Encounter (Signed)
She could take an extra 1/2 pill of the atenolol at night if she needs it for increased HR.  Dr. Debara Pickett

## 2013-12-04 ENCOUNTER — Ambulatory Visit (INDEPENDENT_AMBULATORY_CARE_PROVIDER_SITE_OTHER): Payer: BC Managed Care – PPO | Admitting: Internal Medicine

## 2013-12-04 ENCOUNTER — Encounter: Payer: Self-pay | Admitting: Internal Medicine

## 2013-12-04 VITALS — BP 96/70 | HR 81 | Ht 62.0 in | Wt 159.2 lb

## 2013-12-04 DIAGNOSIS — F411 Generalized anxiety disorder: Secondary | ICD-10-CM

## 2013-12-04 DIAGNOSIS — Z91199 Patient's noncompliance with other medical treatment and regimen due to unspecified reason: Secondary | ICD-10-CM

## 2013-12-04 DIAGNOSIS — F419 Anxiety disorder, unspecified: Secondary | ICD-10-CM

## 2013-12-04 DIAGNOSIS — Z9119 Patient's noncompliance with other medical treatment and regimen: Secondary | ICD-10-CM

## 2013-12-04 DIAGNOSIS — I471 Supraventricular tachycardia: Secondary | ICD-10-CM

## 2013-12-04 DIAGNOSIS — Z9114 Patient's other noncompliance with medication regimen: Secondary | ICD-10-CM

## 2013-12-04 MED ORDER — ATENOLOL 25 MG PO TABS: 25.0000 mg | ORAL_TABLET | Freq: Every day | ORAL | Status: DC

## 2013-12-04 NOTE — Patient Instructions (Signed)
Take atenolol 25mg  once daily - may take extra 1/2 tablet as needed.  Stop Remeron.   Your physician wants you to follow-up in: 1 year. You will receive a reminder letter in the mail two months in advance. If you don't receive a letter, please call our office to schedule the follow-up appointment.

## 2013-12-04 NOTE — Progress Notes (Signed)
OFFICE NOTE  Chief Complaint:  Fatigue  Primary Care Physician: Beatris Si  HPI:  Sarah [name] is a 53 year old female patient whose husband is also my patient. She presents for the first time for evaluation of weight gain. She says that she's gained 8-10 pounds over the past 2 weeks and then 8-10 pounds prior to that. Despite this she denies eating more food, but has had no lower extremity swelling, orthopnea, PND or other heart failure symptoms. She does report shortness of breath however which is chronic and not necessarily associated with exertion. She has some chronic complaints of fatigue and actually appears to be on quite a few medications. She denies taking both clonazepam and diazepam, but they're both on her medicine list. Her dose of the clonazepam is 2 mg twice daily. She additionally takes Ambien 10 mg every night along with Remeron and Inderal 10 mg 3 times daily. The accommodation of all these medications can be extremely sedating and it appears that she is quite sedate today. She does report a history of SVT in the past and has been in the emergency room numerous times for this. She also tells me that she is planning to see a thyroid specialist and may have some issue with her thyroid. The reason for a number of her anti-anxiety medications is long standing anxiety and it does seem that this is associated with her palpitations. She denies any cardiac sounding chest pain.  Sarah [name] had an echocardiogram which showed a preserved EF of 55-60% and no evidence for heart failure. Her weight has remained the same and her swelling has improved. She seems to be tolerating atenolol better than her nonselective beta blocker. Also by decreasing her clonazepam and some of her other sedating medications she is remarkably more awake. She is still complaining of fatigue. She says she feels like she can sleep all the time. She reports intolerance to hot weather.  Finally, she says she  has breakthrough palpitations in the afternoon and has been using an extra half tablet of atenolol in the afternoon.  PMHx:  Past Medical History  Diagnosis Date  . Ovarian cancer   . Anxiety   . Rapid heart rate   . Migraine   . Depression   . Stress incontinence   . SVT (supraventricular tachycardia)     Past Surgical History  Procedure Laterality Date  . Ovary surgery    . Cholecystectomy    . Abdominal hysterectomy    . Mouth surgery      stone  . Bladder tack    . Foot surgery      FAMHx:  Family History  Problem Relation Age of Onset  . Hypertension Mother   . Hypertension Father     SOCHx:   reports that she has been smoking Cigarettes.  She has a 2.5 pack-year smoking history. She does not have any smokeless tobacco history on file. She reports that she does not drink alcohol or use illicit drugs.  ALLERGIES:  Allergies  Allergen Reactions  . Avelox [Moxifloxacin Hcl In Nacl] Palpitations  . Iodine Swelling and Other (See Comments)    Sensation of internal heat    ROS: A comprehensive review of systems was negative except for: Constitutional: positive for weight gain, fatigue Respiratory: positive for dyspnea on exertion Cardiovascular: positive for palpitations Behavioral/Psych: positive for anxiety and excessive sedation  HOME MEDS: Current Outpatient Prescriptions  Medication Sig Dispense Refill  . aspirin 81 MG tablet Take  81 mg by mouth daily.        Marland Kitchen atenolol (TENORMIN) 25 MG tablet Take 1 tablet (25 mg total) by mouth daily. May take extra 1/2 tablet in afternoon as needed.  60 tablet  11  . clonazePAM (KLONOPIN) 2 MG tablet Take 2 mg by mouth daily as needed for anxiety.       . Cyanocobalamin (B-12 PO) Take by mouth.      . Fluticasone Furoate-Vilanterol (BREO ELLIPTA) 100-25 MCG/INH AEPB Inhale into the lungs as needed.      . Omega-3 Fatty Acids (FISH OIL) 1200 MG CAPS Take 1 capsule by mouth daily.      . pantoprazole (PROTONIX) 20 MG  tablet Take 20 mg by mouth daily.       . pravastatin (PRAVACHOL) 20 MG tablet Take by mouth daily.        Marland Kitchen Umeclidinium-Vilanterol (ANORO ELLIPTA IN) Inhale into the lungs daily. Breo      . zolpidem (AMBIEN) 10 MG tablet Take 10 mg by mouth at bedtime as needed.         No current facility-administered medications for this visit.    LABS/IMAGING: No results found for this or any previous visit (from the past 48 hour(s)). No results found.  VITALS: BP 96/70  Pulse 81  Ht 5\' 2"  (1.575 m)  Wt 159 lb 3.2 oz (72.213 kg)  BMI 29.11 kg/m2  EXAM: GEN: Awake, in no apparent distress HEENT: PERRLA, EOMI Lungs: Clear auscultation bilaterally Cardiovascular: Regular in rhythm normal S1-S2 Abdomen: Soft, nontender, positive bowel sounds Extremities: No edema Psych: Mildly sedated Neurologic: No focal deficits  EKG: Normal sinus rhythm at 81  ASSESSMENT: 1. PSVT - controlled on atenolol 2. Dyspnea on exertion - EF 55-60% 3. Stable weight 4. Dyslipidemia 5. Excessive medical sedation - much improved with decreases in her medicine  PLAN: 1.   Sarah [name] still has some fatigue and sedation.  I suggested that she try to wean off of mirtazapine. She should decrease her dose to 7.5 mg for one week and then discontinue this. She can stay on the Ambien for sleep at night. She has reduced her clonazepam significantly. I think this will continue to help with her sedation. We will go ahead and renew her atenolol and she should continue to take 25 mg daily and an extra 12.5 mg in the afternoon as needed for tachycardia. Plan to see her back annually or sooner as necessary.  Pixie Casino, MD, [facility] Attending Cardiologist [facility]  Reshonda Koerber C 12/04/2013, 1:44 PM

## 2014-01-30 IMAGING — CR DG CHEST 2V
2 series · 2 of 2 positions shown · non-contrast
Comparison: 08/11/2011.

CLINICAL DATA: Cough.

CHEST - 2 VIEW

[w chest pa]
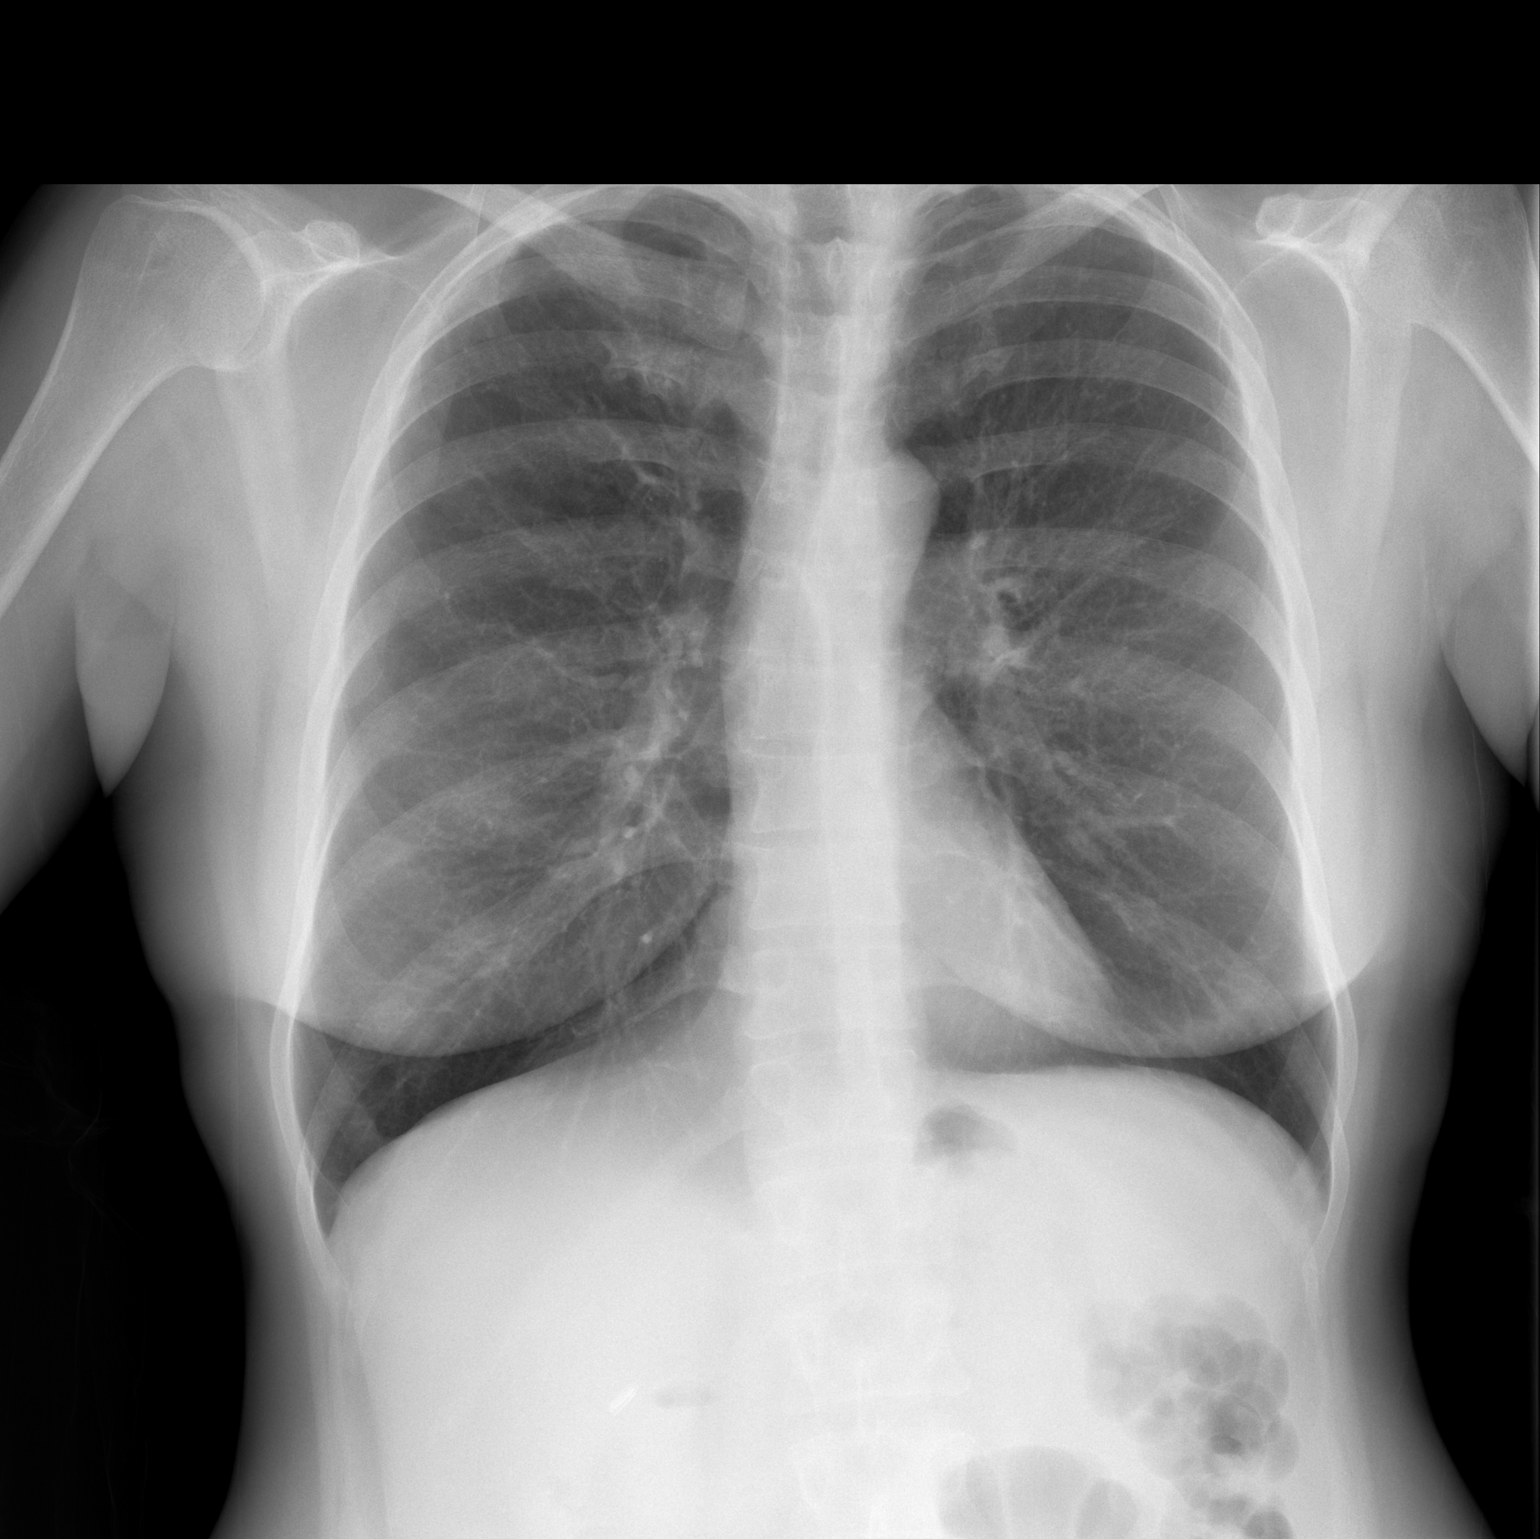

[w chest lat]
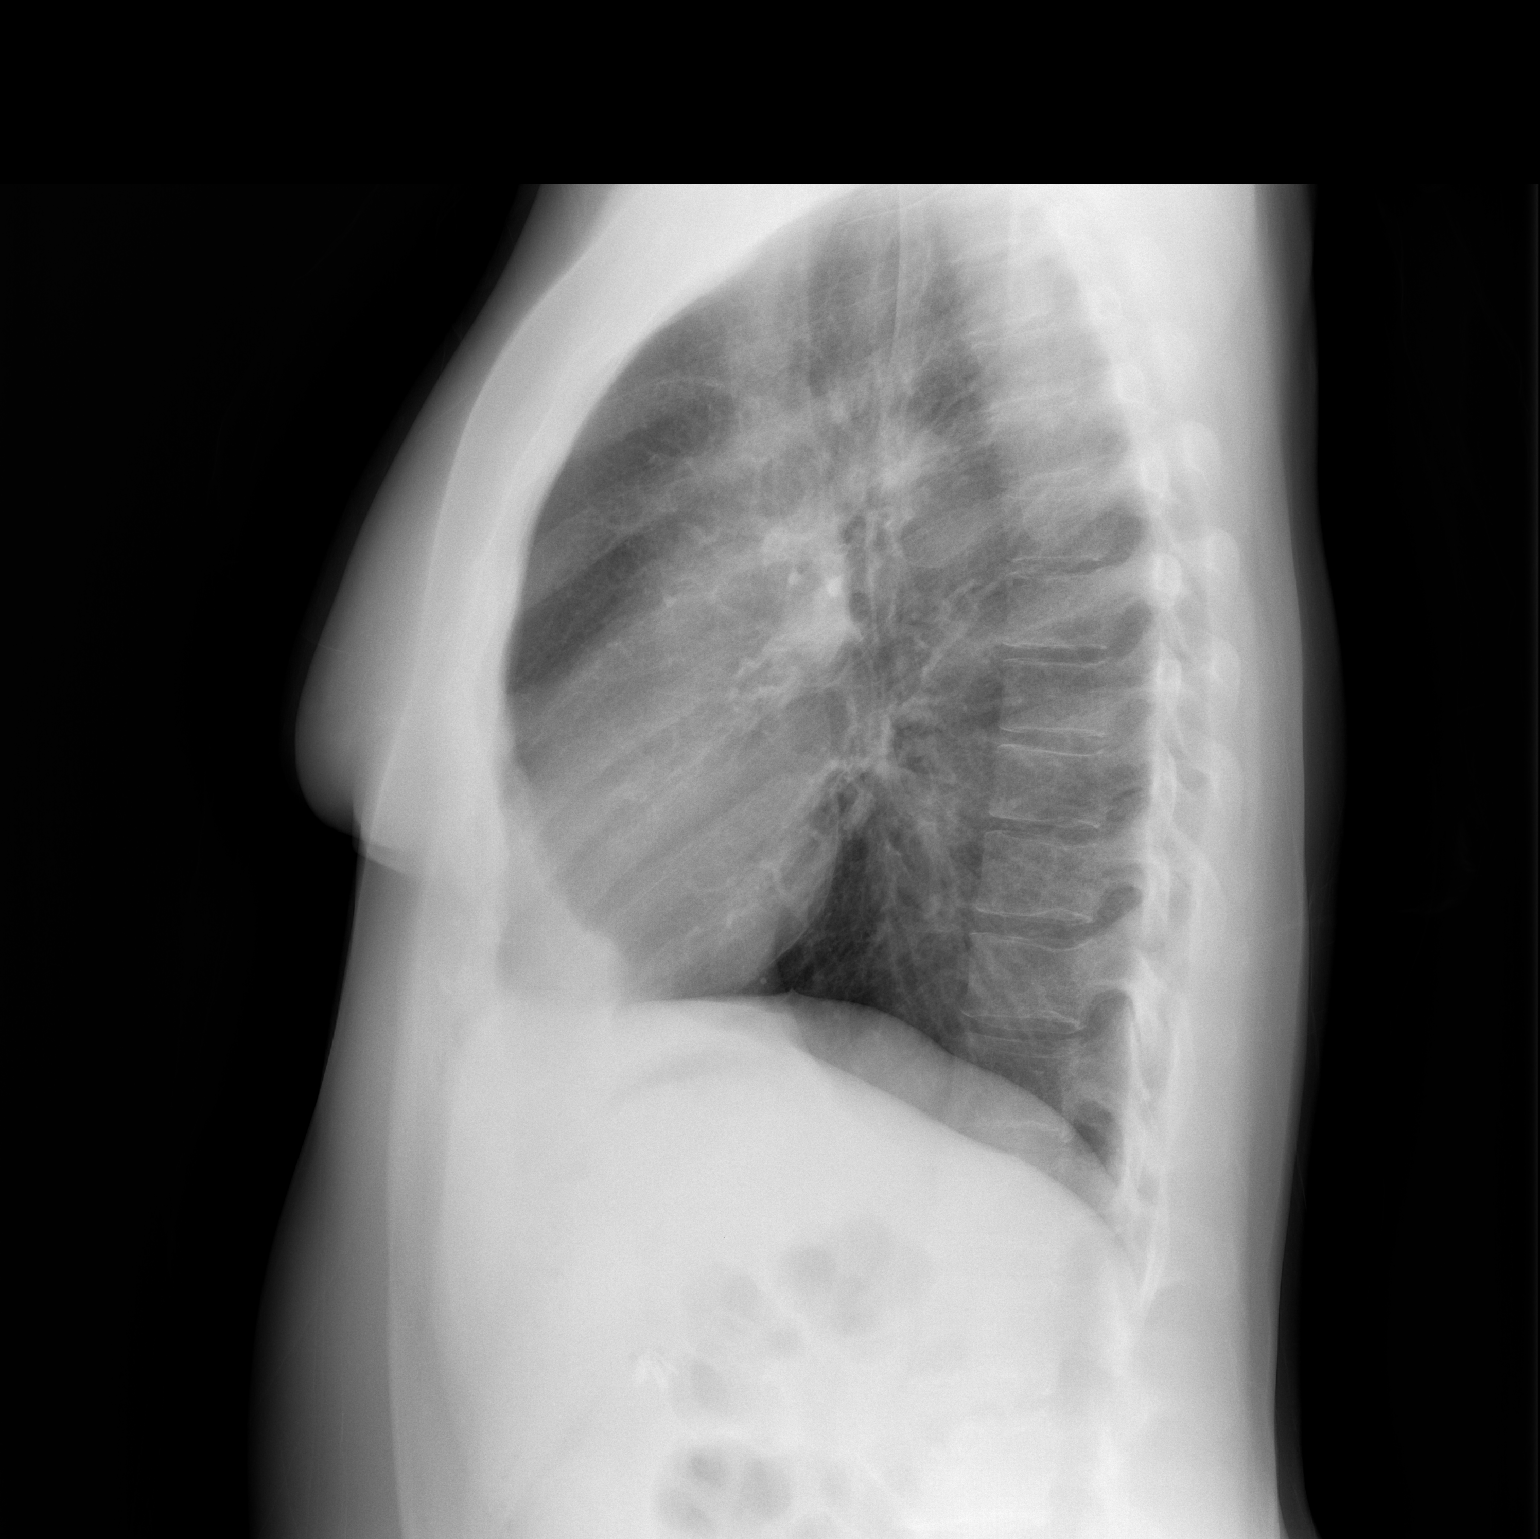

[2 of 2 positions shown; findings below may reference images not displayed]

FINDINGS: The cardiac silhouette, mediastinal and hilar contours
are stable.  The lungs are clear.  No pleural effusion.  The bony
thorax is intact.
IMPRESSION: Normal chest x-ray.

## 2014-03-23 ENCOUNTER — Other Ambulatory Visit (HOSPITAL_BASED_OUTPATIENT_CLINIC_OR_DEPARTMENT_OTHER): Payer: Self-pay | Admitting: Physician Assistant

## 2014-03-23 ENCOUNTER — Ambulatory Visit (HOSPITAL_BASED_OUTPATIENT_CLINIC_OR_DEPARTMENT_OTHER)
Admission: RE | Admit: 2014-03-23 | Discharge: 2014-03-23 | Disposition: A | Discharge status: Home / Self Care | Payer: BC Managed Care – PPO | Source: Ambulatory Visit | Attending: Physician Assistant | Admitting: Physician Assistant

## 2014-03-23 DIAGNOSIS — Z1231 Encounter for screening mammogram for malignant neoplasm of breast: Secondary | ICD-10-CM | POA: Insufficient documentation

## 2014-04-18 ENCOUNTER — Emergency Department (HOSPITAL_BASED_OUTPATIENT_CLINIC_OR_DEPARTMENT_OTHER): Payer: BC Managed Care – PPO

## 2014-04-18 ENCOUNTER — Encounter (HOSPITAL_BASED_OUTPATIENT_CLINIC_OR_DEPARTMENT_OTHER): Payer: Self-pay

## 2014-04-18 ENCOUNTER — Emergency Department (HOSPITAL_BASED_OUTPATIENT_CLINIC_OR_DEPARTMENT_OTHER)
Admission: EM | Admit: 2014-04-18 | Discharge: 2014-04-18 | Disposition: A | Discharge status: Home / Self Care | Payer: BC Managed Care – PPO | Attending: Emergency Medicine | Admitting: Emergency Medicine

## 2014-04-18 DIAGNOSIS — R110 Nausea: Secondary | ICD-10-CM | POA: Insufficient documentation

## 2014-04-18 DIAGNOSIS — Z8543 Personal history of malignant neoplasm of ovary: Secondary | ICD-10-CM | POA: Insufficient documentation

## 2014-04-18 DIAGNOSIS — R079 Chest pain, unspecified: Secondary | ICD-10-CM | POA: Diagnosis present

## 2014-04-18 DIAGNOSIS — G43909 Migraine, unspecified, not intractable, without status migrainosus: Secondary | ICD-10-CM | POA: Insufficient documentation

## 2014-04-18 DIAGNOSIS — R0602 Shortness of breath: Secondary | ICD-10-CM | POA: Insufficient documentation

## 2014-04-18 DIAGNOSIS — E785 Hyperlipidemia, unspecified: Secondary | ICD-10-CM | POA: Diagnosis not present

## 2014-04-18 DIAGNOSIS — F419 Anxiety disorder, unspecified: Secondary | ICD-10-CM | POA: Diagnosis not present

## 2014-04-18 DIAGNOSIS — R1013 Epigastric pain: Secondary | ICD-10-CM | POA: Diagnosis not present

## 2014-04-18 DIAGNOSIS — Z8742 Personal history of other diseases of the female genital tract: Secondary | ICD-10-CM | POA: Insufficient documentation

## 2014-04-18 DIAGNOSIS — I471 Supraventricular tachycardia: Secondary | ICD-10-CM | POA: Insufficient documentation

## 2014-04-18 DIAGNOSIS — Z720 Tobacco use: Secondary | ICD-10-CM | POA: Diagnosis not present

## 2014-04-18 DIAGNOSIS — F329 Major depressive disorder, single episode, unspecified: Secondary | ICD-10-CM | POA: Insufficient documentation

## 2014-04-18 DIAGNOSIS — Z7982 Long term (current) use of aspirin: Secondary | ICD-10-CM | POA: Diagnosis not present

## 2014-04-18 DIAGNOSIS — Z79899 Other long term (current) drug therapy: Secondary | ICD-10-CM | POA: Insufficient documentation

## 2014-04-18 LAB — COMPREHENSIVE METABOLIC PANEL
ALBUMIN: 4.1 g/dL (ref 3.5–5.2)
ALK PHOS: 69 U/L (ref 39–117)
ALT: 18 U/L (ref 0–35)
ANION GAP: 13 (ref 5–15)
AST: 18 U/L (ref 0–37)
BILIRUBIN TOTAL: 0.3 mg/dL (ref 0.3–1.2)
BUN: 10 mg/dL (ref 6–23)
CHLORIDE: 101 meq/L (ref 96–112)
CO2: 27 mEq/L (ref 19–32)
Calcium: 9.4 mg/dL (ref 8.4–10.5)
Creatinine, Ser: 0.9 mg/dL (ref 0.50–1.10)
GFR calc non Af Amer: 72 mL/min — ABNORMAL LOW (ref >90)
GFR, EST AFRICAN AMERICAN: 83 mL/min — AB (ref >90)
GLUCOSE: 114 mg/dL — AB (ref 70–99)
POTASSIUM: 3.9 meq/L (ref 3.7–5.3)
SODIUM: 141 meq/L (ref 137–147)
Total Protein: 7.2 g/dL (ref 6.0–8.3)

## 2014-04-18 LAB — CBC
HEMATOCRIT: 37.2 % (ref 36.0–46.0)
HEMOGLOBIN: 12.8 g/dL (ref 12.0–15.0)
MCH: 28.1 pg (ref 26.0–34.0)
MCHC: 34.4 g/dL (ref 30.0–36.0)
MCV: 81.8 fL (ref 78.0–100.0)
Platelets: 148 10*3/uL — ABNORMAL LOW (ref 150–400)
RBC: 4.55 MIL/uL (ref 3.87–5.11)
RDW: 13.6 % (ref 11.5–15.5)
WBC: 4.8 10*3/uL (ref 4.0–10.5)

## 2014-04-18 LAB — TROPONIN I
Troponin I: <0.3 ng/mL (ref <0.30)
Troponin I: <0.3 ng/mL (ref <0.30)

## 2014-04-18 MED ORDER — GI COCKTAIL ~~LOC~~
30.0000 mL | Freq: Once | ORAL | Status: AC
  Administered 2014-04-18: 30 mL via ORAL
  Filled 2014-04-18: qty 30

## 2014-04-18 MED ORDER — SUCRALFATE 1 G PO TABS: 1.0000 g | ORAL_TABLET | Freq: Three times a day (TID) | ORAL | Status: DC

## 2014-04-18 NOTE — Discharge Instructions (Signed)

## 2014-04-18 NOTE — ED Provider Notes (Signed)
CSN: [id number]     Arrival date & time 04/18/14  1614 History  This chart was scribed for Malvin Johns, MD by Peyton Bottoms, ED Scribe. This patient was seen in room MH06/MH06 and the patient's care was started at 4:42 PM.   Chief Complaint  Patient presents with  . Chest Pain   Patient is a 53 y.o. female presenting with chest pain. The history is provided by the patient. No language interpreter was used.  Chest Pain Associated symptoms: abdominal pain, nausea and shortness of breath   Associated symptoms: no back pain, no cough, no diaphoresis, no dizziness, no fatigue, no fever, no headache, no numbness, not vomiting and no weakness    HPI Comments: Sarah [name] is a 53 y.o. female with a history of rapid heart rate, SVT, ovary surgery, cholecystectomy, who presents to the Emergency Department complaining of moderate abdominal and chest pain that waxes and wanes, which began while patient was eating a sandwich earlier today. She reports pain to mid abdomen radiating to epigastric area and up to center of chest. She reports associated nausea, and SOB with onset of severe pain, no SOB other than that associated with pain onset.  . She denies fevers, recent illnesses, abnormal bowel movements. Patient is a smoker. Patient denies family history of cardiac problems. She states that she has had acid reflux in the past, but states that the pain today is worse than what she has felt in the past for acid reflux. Patient denies any history of abdominal or cardiac surgeries. She states she currently takes medications for hyperlipidemia.  Past Medical History  Diagnosis Date  . Ovarian cancer   . Anxiety   . Rapid heart rate   . Migraine   . Depression   . Stress incontinence   . SVT (supraventricular tachycardia)    Past Surgical History  Procedure Laterality Date  . Ovary surgery    . Cholecystectomy    . Abdominal hysterectomy    . Mouth surgery      stone  . Bladder tack    . Foot  surgery     Family History  Problem Relation Age of Onset  . Hypertension Mother   . Hypertension Father    History  Substance Use Topics  . Smoking status: Current Every Day Smoker -- 0.50 packs/day for 5 years    Types: Cigarettes  . Smokeless tobacco: Not on file     Comment: quit previously, restarted in 2007  . Alcohol Use: No   OB History    No data available     Review of Systems  Constitutional: Negative for fever, chills, diaphoresis and fatigue.  HENT: Negative for congestion, rhinorrhea and sneezing.   Eyes: Negative.   Respiratory: Positive for shortness of breath. Negative for cough and chest tightness.   Cardiovascular: Positive for chest pain. Negative for leg swelling.  Gastrointestinal: Positive for nausea and abdominal pain. Negative for vomiting, diarrhea and blood in stool.  Genitourinary: Negative for frequency, hematuria, flank pain and difficulty urinating.  Musculoskeletal: Negative for back pain and arthralgias.  Skin: Negative for rash.  Neurological: Negative for dizziness, speech difficulty, weakness, numbness and headaches.   Allergies  Avelox and Iodine  Home Medications   Prior to Admission medications   Medication Sig Start Date End Date Taking? Authorizing Provider  aspirin 81 MG tablet Take 81 mg by mouth daily.      Historical Provider, MD  atenolol (TENORMIN) 25 MG tablet Take 1 tablet (  25 mg total) by mouth daily. May take extra 1/2 tablet in afternoon as needed. 12/04/13   Pixie Casino, MD  clonazePAM (KLONOPIN) 2 MG tablet Take 2 mg by mouth daily as needed for anxiety.     Historical Provider, MD  Cyanocobalamin (B-12 PO) Take by mouth.    Historical Provider, MD  Fluticasone Furoate-Vilanterol (BREO ELLIPTA) 100-25 MCG/INH AEPB Inhale into the lungs as needed.    Historical Provider, MD  Omega-3 Fatty Acids (FISH OIL) 1200 MG CAPS Take 1 capsule by mouth daily.    Historical Provider, MD  pantoprazole (PROTONIX) 20 MG tablet Take  20 mg by mouth daily.     Historical Provider, MD  pravastatin (PRAVACHOL) 20 MG tablet Take by mouth daily.      Historical Provider, MD  sucralfate (CARAFATE) 1 G tablet Take 1 tablet (1 g total) by mouth 4 (four) times daily -  with meals and at bedtime. 04/18/14   Malvin Johns, MD  Umeclidinium-Vilanterol (ANORO ELLIPTA IN) Inhale into the lungs daily. Breo    Historical Provider, MD  zolpidem (AMBIEN) 10 MG tablet Take 10 mg by mouth at bedtime as needed.      Historical Provider, MD   Triage Vitals: BP 110/75 mmHg  Pulse 84  Temp(Src) 97.7 F (36.5 C) (Oral)  Resp 18  Ht 5\' 2"  (1.575 m)  Wt 140 lb (63.504 kg)  BMI 25.60 kg/m2  SpO2 96%  Physical Exam  Constitutional: She is oriented to person, place, and time. She appears well-developed and well-nourished.  HENT:  Head: Normocephalic and atraumatic.  Eyes: Pupils are equal, round, and reactive to light.  Neck: Normal range of motion. Neck supple.  Cardiovascular: Normal rate, regular rhythm and normal heart sounds.   Pulmonary/Chest: Effort normal and breath sounds normal. No respiratory distress. She has no wheezes. She has no rales. She exhibits no tenderness.  Abdominal: Soft. Bowel sounds are normal. There is no tenderness. There is no rebound and no guarding.  Mild tenderness in epigastrium.  Musculoskeletal: Normal range of motion. She exhibits no edema.  Lymphadenopathy:    She has no cervical adenopathy.  Neurological: She is alert and oriented to person, place, and time.  Skin: Skin is warm and dry. No rash noted.  Psychiatric: She has a normal mood and affect.  Nursing note and vitals reviewed.  ED Course  Procedures (including critical care time)  DIAGNOSTIC STUDIES: Oxygen Saturation is 96% on RA, normal by my interpretation.    COORDINATION OF CARE: 4:52 PM- Discussed plans to obtain diagnostic CXR, EKG, and lab work. Pt advised of plan for treatment and pt agrees.  Labs Review Labs Reviewed  CBC -  Abnormal; Notable for the following:    Platelets 148 (*)    All other components within normal limits  COMPREHENSIVE METABOLIC PANEL - Abnormal; Notable for the following:    Glucose, Bld 114 (*)    GFR calc non Af Amer 72 (*)    GFR calc Af Amer 83 (*)    All other components within normal limits  TROPONIN I  TROPONIN I   Imaging Review Dg Chest 2 View  04/18/2014   CLINICAL DATA:  Chest pain. Smoking history. Coughing and shortness of breath. Fever. Pain extending from the upper chest through the abdomen.  EXAM: CHEST  2 VIEW  COMPARISON:  03/31/2014  FINDINGS: Heart size is normal. Mediastinal shadows are normal. The lungs are clear except for mild scarring at the apices. No infiltrate,  mass, effusion or collapse. No significant bony finding.  IMPRESSION: No active disease.  Mild chronic scarring in the upper lobes.   Electronically Signed   By: Nelson Chimes M.D.   On: 04/18/2014 17:30     EKG Interpretation   Date/Time:  Saturday April 18 2014 16:25:28 EST Ventricular Rate:  82 PR Interval:  138 QRS Duration: 84 QT Interval:  366 QTC Calculation: 427 R Axis:   68 Text Interpretation:  Normal sinus rhythm Nonspecific T wave abnormality  Abnormal ECG since last tracing no significant change Confirmed by Eesha Schmaltz   MD, Vallie Teters ([id number]) on 04/18/2014 4:41:33 PM     MDM   Final diagnoses:  Epigastric pain    PT presents with spasmodic pain to upper abd radiating to chest.  Pt pain better now.  No pain over gallbladder, doubt ACS.  Has had 2 neg troponin, Heart score 3.  Lipase normal.  Pain resolved after GI cocktail, likely gastritis.  D/c with recommended close f/u with PMD  I personally performed the services described in this documentation, which was scribed in my presence. The recorded information has been reviewed and is accurate.  Malvin Johns, MD 04/19/14 [phone]

## 2014-04-18 NOTE — ED Notes (Signed)
Pt reports left sided chest pain, epigastric pain that began PTA - also reports nausea, shortness of breath.

## 2014-04-20 ENCOUNTER — Telehealth: Payer: Self-pay | Admitting: Internal Medicine

## 2014-04-20 NOTE — Telephone Encounter (Signed)
Spoke with pt, she was seen in the ER for extreme pain from acid reflux and was told she needs a stress test because her bp runs so low. She is currently being tested for fibromyalgia and lupus and reports she can not due a stress test because of the weakness in her legs. She reports her bp runs from 90/60 to 70/58 at home. She is taking atenolol 25 mg in the morning and 1/2 tab in the afternoon if her heart rate is above 100. Her medical doctor wanted her to call to see if there was something to use for heart rate that will not effect her bp. Will forward for dr hilty's review.

## 2014-04-20 NOTE — Telephone Encounter (Signed)
Sarah [name] is calling because she had very bad pain this past weekend and was told that she needed to have a stress test , but she feels that she cannot to the stress test at this time . Wants to speak to her cardiologist about changing her meds  And taking the stress test , as well as bottoming out her BP.Marland Kitchen Please Call

## 2014-04-21 ENCOUNTER — Telehealth: Payer: Self-pay | Admitting: Internal Medicine

## 2014-04-21 ENCOUNTER — Other Ambulatory Visit: Payer: Self-pay | Admitting: *Deleted

## 2014-04-21 MED ORDER — ATENOLOL 25 MG PO TABS: 25.0000 mg | ORAL_TABLET | Freq: Every day | ORAL | Status: DC

## 2014-04-21 NOTE — Telephone Encounter (Signed)
Left message to call back  ( per Dr Debara Pickett patient needs an appointment to discuss)

## 2014-04-21 NOTE — Telephone Encounter (Signed)
Pt is calling back today,she is trying out what was decided.

## 2014-04-21 NOTE — Telephone Encounter (Signed)
SPOKE TO PATIENT APPOINTMENT SCHEDULE 3:15 PM 04/29/14

## 2014-04-21 NOTE — Telephone Encounter (Signed)
Rx refill sent to patient pharmacy   

## 2014-04-21 NOTE — Telephone Encounter (Signed)
I would need to see her back in the office to address these new issues.  Dr. Lemmie Evens

## 2014-04-22 NOTE — Telephone Encounter (Signed)
Patient to be seen in office 11/25 with Dr. Debara Pickett

## 2014-04-24 ENCOUNTER — Ambulatory Visit (INDEPENDENT_AMBULATORY_CARE_PROVIDER_SITE_OTHER): Payer: BC Managed Care – PPO | Admitting: Internal Medicine

## 2014-04-24 ENCOUNTER — Encounter: Payer: Self-pay | Admitting: Internal Medicine

## 2014-04-24 VITALS — BP 101/75 | HR 81 | Ht 62.0 in | Wt 155.6 lb

## 2014-04-24 DIAGNOSIS — F419 Anxiety disorder, unspecified: Secondary | ICD-10-CM

## 2014-04-24 DIAGNOSIS — I471 Supraventricular tachycardia: Secondary | ICD-10-CM

## 2014-04-24 DIAGNOSIS — R635 Abnormal weight gain: Secondary | ICD-10-CM

## 2014-04-24 DIAGNOSIS — R0609 Other forms of dyspnea: Secondary | ICD-10-CM

## 2014-04-24 NOTE — Patient Instructions (Signed)
Your physician has recommended you make the following change in your medication: STOP atenolol   Dr. Debara Pickett said it is OK to eat salt/salty foods and liberalize your water intake.   Please follow up in 2 months with Dr. Debara Pickett.

## 2014-04-27 ENCOUNTER — Encounter: Payer: Self-pay | Admitting: Internal Medicine

## 2014-04-27 NOTE — Progress Notes (Signed)
OFFICE NOTE  Chief Complaint:  Fatigue  Primary Care Physician: Beatris Si  HPI:  Sarah [name] is a 53 year old female patient whose husband is also my patient. She presents for the first time for evaluation of weight gain. She says that she's gained 8-10 pounds over the past 2 weeks and then 8-10 pounds prior to that. Despite this she denies eating more food, but has had no lower extremity swelling, orthopnea, PND or other heart failure symptoms. She does report shortness of breath however which is chronic and not necessarily associated with exertion. She has some chronic complaints of fatigue and actually appears to be on quite a few medications. She denies taking both clonazepam and diazepam, but they're both on her medicine list. Her dose of the clonazepam is 2 mg twice daily. She additionally takes Ambien 10 mg every night along with Remeron and Inderal 10 mg 3 times daily. The accommodation of all these medications can be extremely sedating and it appears that she is quite sedate today. She does report a history of SVT in the past and has been in the emergency room numerous times for this. She also tells me that she is planning to see a thyroid specialist and may have some issue with her thyroid. The reason for a number of her anti-anxiety medications is long standing anxiety and it does seem that this is associated with her palpitations. She denies any cardiac sounding chest pain.  Sarah [name] had an echocardiogram which showed a preserved EF of 55-60% and no evidence for heart failure. Her weight has remained the same and her swelling has improved. She seems to be tolerating atenolol better than her nonselective beta blocker. Also by decreasing her clonazepam and some of her other sedating medications she is remarkably more awake. She is still complaining of fatigue. She says she feels like she can sleep all the time. She reports intolerance to hot weather.  Finally, she says she  has breakthrough palpitations in the afternoon and has been using an extra half tablet of atenolol in the afternoon.  She was recently seen in emergency department for upper abdominal pain and it was determined that this was noncardiac. She continues to have similar symptoms that she is presented with in the past including fatigue. Blood pressure is noted to be low normal today at 101/75. She did bring in blood pressure readings from home though that show her blood pressures are in the 80s and sometimes even the 75Z and the systolic. She does feel dizzy with this.  PMHx:  Past Medical History  Diagnosis Date  . Ovarian cancer   . Anxiety   . Rapid heart rate   . Migraine   . Depression   . Stress incontinence   . SVT (supraventricular tachycardia)     Past Surgical History  Procedure Laterality Date  . Ovary surgery    . Cholecystectomy    . Abdominal hysterectomy    . Mouth surgery      stone  . Bladder tack    . Foot surgery      FAMHx:  Family History  Problem Relation Age of Onset  . Hypertension Mother   . Hypertension Father     SOCHx:   reports that she has been smoking Cigarettes.  She has a 2.5 pack-year smoking history. She does not have any smokeless tobacco history on file. She reports that she does not drink alcohol or use illicit drugs.  ALLERGIES:  Allergies  Allergen Reactions  . Avelox [Moxifloxacin Hcl In Nacl] Palpitations  . Iodine Swelling and Other (See Comments)    Sensation of internal heat    ROS: A comprehensive review of systems was negative except for: Constitutional: positive for weight gain, fatigue Respiratory: positive for dyspnea on exertion Cardiovascular: positive for palpitations Behavioral/Psych: positive for anxiety and excessive sedation  HOME MEDS: Current Outpatient Prescriptions  Medication Sig Dispense Refill  . aspirin 81 MG tablet Take 81 mg by mouth daily.      . clonazePAM (KLONOPIN) 2 MG tablet Take 2 mg by mouth  daily as needed for anxiety.     . Cyanocobalamin (B-12 PO) Take by mouth.    . Fluticasone Furoate-Vilanterol (BREO ELLIPTA) 100-25 MCG/INH AEPB Inhale into the lungs as needed.    . Omega-3 Fatty Acids (FISH OIL) 1200 MG CAPS Take 1 capsule by mouth daily.    . pantoprazole (PROTONIX) 20 MG tablet Take 20 mg by mouth daily.     . pravastatin (PRAVACHOL) 20 MG tablet Take by mouth daily.      Marland Kitchen zolpidem (AMBIEN) 10 MG tablet Take 10 mg by mouth at bedtime as needed.       No current facility-administered medications for this visit.    LABS/IMAGING: No results found for this or any previous visit (from the past 48 hour(s)). No results found.  VITALS: BP 101/75 mmHg  Pulse 81  Ht 5\' 2"  (1.575 m)  Wt 155 lb 9.6 oz (70.58 kg)  BMI 28.45 kg/m2  EXAM: GEN: Awake, in no apparent distress HEENT: PERRLA, EOMI Lungs: Clear auscultation bilaterally Cardiovascular: Regular in rhythm normal S1-S2 Abdomen: Soft, nontender, positive bowel sounds Extremities: No edema Psych: Mildly sedated Neurologic: No focal deficits  EKG: deferred  ASSESSMENT: 1. PSVT - controlled on atenolol 2. Dyspnea on exertion - EF 55-60% 3. Stable weight 4. Dyslipidemia 5. Excessive medical sedation - much improved with decreases in her medicine 6. Persistent fatigue  PLAN: 1.   Sarah [name] still has some fatigue and sedation. She is now experiencing hypotension which is symptomatic. She seemed to tolerate atenolol before but is not currently. Unfortunately, we will have to consider discontinuing this medication. This may put her at risk for more palpitations. We may not be able to treat her palpitations without problems with hypotension. We may need to consider EP referral. Options for intermittent tachycardia could include digoxin, which is not as likely to lower blood pressure or if it is felt she has an inappropriate sinus tachycardia, possibly ivabradine, but this would be off-label use and not likely to be  affordable. For now I advised her to stop her atenolol and increase salt intake.  Pixie Casino, MD, [facility] Attending Cardiologist [facility]  Coden Franchi C 04/27/2014, 3:19 PM

## 2014-04-29 ENCOUNTER — Ambulatory Visit: Payer: BC Managed Care – PPO | Admitting: Internal Medicine

## 2014-05-02 ENCOUNTER — Telehealth: Payer: Self-pay | Admitting: Nurse Practitioner

## 2014-05-02 NOTE — Telephone Encounter (Signed)
Pt recently had atenolol discontinued in the setting of hypotension.  She had been on it in the past for a h/o PSVT vs. Inappropriate sinus tach.  She says that this AM, her HR increased to 112 while @ rest, which causes her to have a headache.  Her BP has been in the low 100's (better since d/c'ing atenolol).  She took a 1/2 of an atenolol (12.5mg ) with resolution of Ss.  She currently feels fine.  She called to find out if it was ok to take a 1/2 of atenolol prn elevated HR's.  I advised that so long as her BP is > 34mmHg, she could continue to take a 1/2 tab BID prn for palpitations.  She verbalized understanding and plans to continue to check her BP throughout the day.

## 2014-05-04 ENCOUNTER — Telehealth: Payer: Self-pay | Admitting: Internal Medicine

## 2014-05-04 MED ORDER — ATENOLOL 25 MG PO TABS: 12.5000 mg | ORAL_TABLET | Freq: Two times a day (BID) | ORAL | Status: DC

## 2014-05-04 NOTE — Telephone Encounter (Signed)
Fine with that .Marland Kitchen Glad she feels better and is tolerating that medication.  Dr. Lemmie Evens

## 2014-05-04 NOTE — Telephone Encounter (Signed)
LMTCB

## 2014-05-04 NOTE — Telephone Encounter (Signed)
Patient notified that changes were OK per Dr. Debara Pickett. Patient needed refills - Rx was sent to pharmacy electronically.

## 2014-05-04 NOTE — Telephone Encounter (Signed)
Pt is calling in stating that she is having some problems with Atenolol. She states that she takes a half a pill for when her HR increases but it does not help her BP. She would like to know what to do. Please call  Thanks

## 2014-05-04 NOTE — Telephone Encounter (Signed)
Spoke with patient. She wanted to verify with Dr. Debara Pickett that medication changes made on 11/28 by Angelica Ran, NP were OK.   She called in and c/o HR >110 bpm at rest associated with a headache. She took atenolol 12.5mg  BID (with OK per NP) and has felt much better since. She was advised to NOT take medication if diastolic was under 50.   Sampling of BP readings 93/63 119/73 97/69 88/60  101/64 97/61  HR >60 bpm on this low dose of atenolol   Patient has no complaints.   Please advise if this change is OK

## 2014-06-18 ENCOUNTER — Encounter: Payer: Self-pay | Admitting: Internal Medicine

## 2014-06-18 ENCOUNTER — Ambulatory Visit (INDEPENDENT_AMBULATORY_CARE_PROVIDER_SITE_OTHER): Payer: BLUE CROSS/BLUE SHIELD | Admitting: Internal Medicine

## 2014-06-18 VITALS — BP 90/70 | HR 85 | Ht 62.0 in | Wt 152.1 lb

## 2014-06-18 DIAGNOSIS — R55 Syncope and collapse: Secondary | ICD-10-CM

## 2014-06-18 DIAGNOSIS — F419 Anxiety disorder, unspecified: Secondary | ICD-10-CM

## 2014-06-18 DIAGNOSIS — I951 Orthostatic hypotension: Secondary | ICD-10-CM

## 2014-06-18 DIAGNOSIS — R000 Tachycardia, unspecified: Secondary | ICD-10-CM

## 2014-06-18 DIAGNOSIS — I959 Hypotension, unspecified: Secondary | ICD-10-CM

## 2014-06-18 DIAGNOSIS — I471 Supraventricular tachycardia: Secondary | ICD-10-CM

## 2014-06-18 DIAGNOSIS — M797 Fibromyalgia: Secondary | ICD-10-CM

## 2014-06-18 MED ORDER — MIDODRINE HCL 10 MG PO TABS: 10.0000 mg | ORAL_TABLET | Freq: Three times a day (TID) | ORAL | Status: DC

## 2014-06-18 NOTE — Progress Notes (Signed)
OFFICE NOTE  Chief Complaint:  Fatigue, Near syncope, hypotension  Primary Care Physician: Corine Shelter, PA-C  HPI:  Sarah [name] is a 54 year old female patient whose husband is also my patient. She presents for the first time for evaluation of weight gain. She says that she's gained 8-10 pounds over the past 2 weeks and then 8-10 pounds prior to that. Despite this she denies eating more food, but has had no lower extremity swelling, orthopnea, PND or other heart failure symptoms. She does report shortness of breath however which is chronic and not necessarily associated with exertion. She has some chronic complaints of fatigue and actually appears to be on quite a few medications. She denies taking both clonazepam and diazepam, but they're both on her medicine list. Her dose of the clonazepam is 2 mg twice daily. She additionally takes Ambien 10 mg every night along with Remeron and Inderal 10 mg 3 times daily. The accommodation of all these medications can be extremely sedating and it appears that she is quite sedate today. She does report a history of SVT in the past and has been in the emergency room numerous times for this. She also tells me that she is planning to see a thyroid specialist and may have some issue with her thyroid. The reason for a number of her anti-anxiety medications is long standing anxiety and it does seem that this is associated with her palpitations. She denies any cardiac sounding chest pain.  Sarah [name] had an echocardiogram which showed a preserved EF of 55-60% and no evidence for heart failure. Her weight has remained the same and her swelling has improved. She seems to be tolerating atenolol better than her nonselective beta blocker. Also by decreasing her clonazepam and some of her other sedating medications she is remarkably more awake. She is still complaining of fatigue. She says she feels like she can sleep all the time. She reports intolerance to hot  weather.  Finally, she says she has breakthrough palpitations in the afternoon and has been using an extra half tablet of atenolol in the afternoon.  She was recently seen in emergency department for upper abdominal pain and it was determined that this was noncardiac. She continues to have similar symptoms that she is presented with in the past including fatigue. Blood pressure is noted to be low normal today at 101/75. She did bring in blood pressure readings from home though that show her blood pressures are in the 80s and sometimes even the 84X and the systolic. She does feel dizzy with this.  Sarah [name] is again seen in emergency department minutes at [facility]. She had some loss of power, near-syncope and leg wraps. She felt like she couldn't breathe. In the interim she's been evaluated by rheumatologist and diagnosed with fibromyalgia. She continues to have episodes of inappropriate tachycardia. I also suspect she is having orthostatic hypotension causing her symptomatic episodes of dizziness and near syncope.  PMHx:  Past Medical History  Diagnosis Date  . Ovarian cancer   . Anxiety   . Rapid heart rate   . Migraine   . Depression   . Stress incontinence   . SVT (supraventricular tachycardia)     Past Surgical History  Procedure Laterality Date  . Ovary surgery    . Cholecystectomy    . Abdominal hysterectomy    . Mouth surgery      stone  . Bladder tack    . Foot surgery  FAMHx:  Family History  Problem Relation Age of Onset  . Hypertension Mother   . Hypertension Father     SOCHx:   reports that she has been smoking Cigarettes.  She has a 2.5 pack-year smoking history. She does not have any smokeless tobacco history on file. She reports that she does not drink alcohol or use illicit drugs.  ALLERGIES:  Allergies  Allergen Reactions  . Avelox [Moxifloxacin Hcl In Nacl] Palpitations  . Iodine Swelling and Other (See Comments)    Sensation of internal heat     ROS: A comprehensive review of systems was negative except for: Constitutional: positive for fatigue Respiratory: positive for dyspnea on exertion Cardiovascular: positive for near-syncope and palpitations Behavioral/Psych: positive for anxiety and fibromyalgia  HOME MEDS: Current Outpatient Prescriptions  Medication Sig Dispense Refill  . albuterol (PROVENTIL HFA;VENTOLIN HFA) 108 (90 BASE) MCG/ACT inhaler Inhale 2 puffs into the lungs every 4 (four) hours as needed for wheezing or shortness of breath.    Marland Kitchen aspirin 81 MG tablet Take 81 mg by mouth daily.      Marland Kitchen atenolol (TENORMIN) 25 MG tablet Take 12.5 mg by mouth as needed (palpitations, heart racing).    . Biotin 5 MG CAPS Take 1 capsule by mouth daily.    . cephALEXin (KEFLEX) 500 MG capsule as directed.  0  . clonazePAM (KLONOPIN) 2 MG tablet Take 2 mg by mouth daily.     . Cyanocobalamin (B-12 PO) Take by mouth.    . Fluticasone Furoate-Vilanterol (BREO ELLIPTA) 100-25 MCG/INH AEPB Inhale into the lungs as needed.    . gabapentin (NEURONTIN) 100 MG capsule Take 100 mg by mouth 2 (two) times daily.  0  . meclizine (ANTIVERT) 25 MG tablet Take 25 mg by mouth 3 (three) times daily as needed for dizziness.    . mirabegron ER (MYRBETRIQ) 50 MG TB24 tablet Take 50 mg by mouth daily.    . Omega-3 Fatty Acids (FISH OIL) 1200 MG CAPS Take 2 capsules by mouth 2 (two) times daily.     . pantoprazole (PROTONIX) 40 MG tablet Take 40 mg by mouth 2 (two) times daily.    . pravastatin (PRAVACHOL) 20 MG tablet Take by mouth daily.      Marland Kitchen zolpidem (AMBIEN) 10 MG tablet Take 10 mg by mouth at bedtime as needed.      . midodrine (PROAMATINE) 10 MG tablet Take 1 tablet (10 mg total) by mouth 3 (three) times daily. 90 tablet 6   No current facility-administered medications for this visit.    LABS/IMAGING: No results found for this or any previous visit (from the past 48 hour(s)). No results found.  VITALS: BP 90/70 mmHg  Pulse 85  Ht 5\' 2"   (1.575 m)  Wt 152 lb 1.6 oz (68.992 kg)  BMI 27.81 kg/m2  EXAM: GEN: Awake, in no apparent distress HEENT: PERRLA, EOMI Lungs: Clear auscultation bilaterally Cardiovascular: Regular in rhythm normal S1-S2 Abdomen: Soft, nontender, positive bowel sounds Extremities: No edema Psych: Mildly sedated Neurologic: No focal deficits  EKG: Normal sinus rhythm at 85, nonspecific ST and T changes  ASSESSMENT: 1. PSVT or ?inappropriate sinus tachycardia - controlled on prn atenolol 2. Dyspnea on exertion - EF 55-60% 3. Stable weight 4. Dyslipidemia 5. Persistent fatigue 6. Fibromyalgia 7. Orthostatic hypotension - near syncope  PLAN: 1.   Sarah [name] continues to have fatigue and near syncope probably related to orthostatic hypotension. Blood pressure remains persistently low despite only taking atenolol when necessary  for inappropriate tachycardia. At this point I would recommend she start on low-dose midodrine for hypotension. This may allow her to use her beta blocker as needed for tachycardia arrhythmias. She was recently diagnosed with fibromyalgia based on classic symptoms by rheumatologist. It is not common to have inappropriate tachycardia and autonomic symptoms such as orthostatic hypotension in the setting of fibromyalgia. This can be fairly difficult to treat. We'll continue her current medications and plan to see her back in a month to see if she's improved with respect to hypotension and near syncope. She was also recommended to start on Neurontin. I do think this could be helpful with some of her inappropriate pain signals.  Pixie Casino, MD, [facility] Attending Cardiologist [facility]  HILTY,Kenneth C 06/18/2014, 2:20 PM

## 2014-06-18 NOTE — Patient Instructions (Signed)
Your physician has requested that you have a lexiscan myoview. For further information please visit [email or web address]. Please follow instruction sheet, as given.  Your physician has recommended you make the following change in your medication: START midodrine 10mg  three times daily  Your physician recommends that you schedule a follow-up appointment after your stress test.

## 2014-06-22 ENCOUNTER — Encounter: Payer: Self-pay | Admitting: Internal Medicine

## 2014-06-23 ENCOUNTER — Encounter (HOSPITAL_COMMUNITY): Payer: BLUE CROSS/BLUE SHIELD

## 2014-06-25 ENCOUNTER — Telehealth (HOSPITAL_COMMUNITY): Payer: Self-pay

## 2014-06-25 NOTE — Telephone Encounter (Signed)
Encounter complete. 

## 2014-06-30 ENCOUNTER — Ambulatory Visit (HOSPITAL_COMMUNITY)
Admission: RE | Admit: 2014-06-30 | Discharge: 2014-06-30 | Disposition: A | Discharge status: Home / Self Care | Payer: BLUE CROSS/BLUE SHIELD | Source: Ambulatory Visit | Attending: Cardiology | Admitting: Cardiology

## 2014-06-30 DIAGNOSIS — Z8249 Family history of ischemic heart disease and other diseases of the circulatory system: Secondary | ICD-10-CM | POA: Diagnosis not present

## 2014-06-30 DIAGNOSIS — E785 Hyperlipidemia, unspecified: Secondary | ICD-10-CM | POA: Insufficient documentation

## 2014-06-30 DIAGNOSIS — R002 Palpitations: Secondary | ICD-10-CM | POA: Insufficient documentation

## 2014-06-30 DIAGNOSIS — I471 Supraventricular tachycardia: Secondary | ICD-10-CM

## 2014-06-30 DIAGNOSIS — R42 Dizziness and giddiness: Secondary | ICD-10-CM | POA: Diagnosis present

## 2014-06-30 DIAGNOSIS — R5383 Other fatigue: Secondary | ICD-10-CM | POA: Insufficient documentation

## 2014-06-30 DIAGNOSIS — Z720 Tobacco use: Secondary | ICD-10-CM | POA: Diagnosis not present

## 2014-06-30 DIAGNOSIS — R0609 Other forms of dyspnea: Secondary | ICD-10-CM | POA: Diagnosis not present

## 2014-06-30 DIAGNOSIS — R55 Syncope and collapse: Secondary | ICD-10-CM

## 2014-06-30 DIAGNOSIS — I959 Hypotension, unspecified: Secondary | ICD-10-CM

## 2014-06-30 MED ORDER — AMINOPHYLLINE 25 MG/ML IV SOLN
75.0000 mg | Freq: Once | INTRAVENOUS | Status: AC
  Administered 2014-06-30: 75 mg via INTRAVENOUS

## 2014-06-30 MED ORDER — TECHNETIUM TC 99M SESTAMIBI GENERIC - CARDIOLITE
31.0000 | Freq: Once | INTRAVENOUS | Status: AC | PRN
  Administered 2014-06-30: 31 via INTRAVENOUS

## 2014-06-30 MED ORDER — TECHNETIUM TC 99M SESTAMIBI GENERIC - CARDIOLITE
10.9000 | Freq: Once | INTRAVENOUS | Status: AC | PRN
  Administered 2014-06-30: 10.9 via INTRAVENOUS

## 2014-06-30 MED ORDER — REGADENOSON 0.4 MG/5ML IV SOLN
0.4000 mg | Freq: Once | INTRAVENOUS | Status: AC
  Administered 2014-06-30: 0.4 mg via INTRAVENOUS

## 2014-06-30 NOTE — Procedures (Addendum)
[place] [address] [place] [address] [phone]  Cardiology Nuclear Med Study  Sarah [name] is a 54 y.o. female     MRN : [id number]     DOB: 18-Oct-1960  Procedure Date: 06/30/2014  Nuclear Med Background Indication for Stress Test:  [facility] and Evaluate for ischemia. History:  COPD and PSVT;No prior NUC MPI for comparison. Cardiac Risk Factors: Family History - CAD, Lipids and Smoker  Symptoms:  Dizziness, DOE, Fatigue, Light-Headedness and Palpitations   Nuclear Pre-Procedure Caffeine/Decaff Intake:  1:00am NPO After: 11am   IV Site: R Forearm  IV 0.9% NS with Angio Cath:  22g  Chest Size (in):  n/a IV Started by: Rolene Course, RN  Height: 5\' 2"  (1.575 m)  Cup Size: A  BMI:  Body mass index is 28.34 kg/(m^2). Weight:  155 lb (70.308 kg)   Tech Comments:  n/a    Nuclear Med Study 1 or 2 day study: 1 day  Stress Test Type:  [place] Provider:  Lyman Bishop, MD   Resting Radionuclide: Technetium 75m Sestamibi  Resting Radionuclide Dose: 10.9 mCi   Stress Radionuclide:  Technetium 73m Sestamibi  Stress Radionuclide Dose: 31.0 mCi           Stress Protocol Rest HR: 70 Stress HR: 115  Rest BP: 115/75 Stress BP: 121/81  Exercise Time (min): n/a METS: n/a          Dose of Adenosine (mg):  n/a Dose of Lexiscan: 0.4 mg  Dose of Atropine (mg): n/a Dose of Dobutamine: n/a mcg/kg/min (at max HR)  Stress Test Technologist: Mellody Memos, CCT Nuclear Technologist: Imagene Riches, CNMT   Rest Procedure:  Myocardial perfusion imaging was performed at rest 45 minutes following the intravenous administration of Technetium 37m Sestamibi. Stress Procedure:  The patient received IV Lexiscan 0.4 mg over 15-seconds.  Technetium 71m Sestamibi injected IV at 30-seconds.  Patient experienced shortness of breath, light-headedness and was administered 75 mg of Aminophylline IV. There were no  significant changes with Lexiscan.  Quantitative spect images were obtained after a 45 minute delay.  Transient Ischemic Dilatation (Normal <1.22):  1.11  QGS EDV:  58 ml QGS ESV:  22 ml LV Ejection Fraction: 62%  Rest ECG: NSR - Normal EKG  Stress ECG: No significant ST segment change suggestive of ischemia.  QPS Raw Data Images:  Normal; no motion artifact; normal heart/lung ratio. Stress Images:  Normal homogeneous uptake in all areas of the myocardium. Rest Images:  Normal homogeneous uptake in all areas of the myocardium. Subtraction (SDS):  No evidence of ischemia.  Impression Exercise Capacity:  Lexiscan with no exercise. BP Response:  Normal blood pressure response. Clinical Symptoms:  No significant symptoms noted. ECG Impression:  No significant ST segment change suggestive of ischemia. Comparison with Prior Nuclear Study: No previous nuclear study performed  Overall Impression:  Normal stress nuclear study.  LV Wall Motion:  NL LV Function; NL Wall Motion; LVEF 62%  Pixie Casino, MD, [facility] Board Certified in Nuclear Cardiology Attending Cardiologist [place], MD  06/30/2014 5:11 PM

## 2014-07-10 ENCOUNTER — Encounter: Payer: Self-pay | Admitting: Internal Medicine

## 2014-07-10 ENCOUNTER — Ambulatory Visit (INDEPENDENT_AMBULATORY_CARE_PROVIDER_SITE_OTHER): Payer: BLUE CROSS/BLUE SHIELD | Admitting: Internal Medicine

## 2014-07-10 VITALS — BP 94/68 | HR 76 | Ht 62.0 in | Wt 149.7 lb

## 2014-07-10 DIAGNOSIS — R000 Tachycardia, unspecified: Secondary | ICD-10-CM

## 2014-07-10 DIAGNOSIS — R0609 Other forms of dyspnea: Secondary | ICD-10-CM

## 2014-07-10 DIAGNOSIS — F419 Anxiety disorder, unspecified: Secondary | ICD-10-CM

## 2014-07-10 DIAGNOSIS — I471 Supraventricular tachycardia: Secondary | ICD-10-CM

## 2014-07-10 DIAGNOSIS — M797 Fibromyalgia: Secondary | ICD-10-CM

## 2014-07-10 DIAGNOSIS — I951 Orthostatic hypotension: Secondary | ICD-10-CM

## 2014-07-10 NOTE — Patient Instructions (Signed)
Your physician wants you to follow-up in: 6 months with Dr. Hilty. You will receive a reminder letter in the mail two months in advance. If you don't receive a letter, please call our office to schedule the follow-up appointment.    

## 2014-07-10 NOTE — Progress Notes (Signed)
OFFICE NOTE  Chief Complaint:  Feels better  Primary Care Physician: Beatris Si  HPI:  Sarah [name] is a 54 year old female patient whose husband is also my patient. She presents for the first time for evaluation of weight gain. She says that she's gained 8-10 pounds over the past 2 weeks and then 8-10 pounds prior to that. Despite this she denies eating more food, but has had no lower extremity swelling, orthopnea, PND or other heart failure symptoms. She does report shortness of breath however which is chronic and not necessarily associated with exertion. She has some chronic complaints of fatigue and actually appears to be on quite a few medications. She denies taking both clonazepam and diazepam, but they're both on her medicine list. Her dose of the clonazepam is 2 mg twice daily. She additionally takes Ambien 10 mg every night along with Remeron and Inderal 10 mg 3 times daily. The accommodation of all these medications can be extremely sedating and it appears that she is quite sedate today. She does report a history of SVT in the past and has been in the emergency room numerous times for this. She also tells me that she is planning to see a thyroid specialist and may have some issue with her thyroid. The reason for a number of her anti-anxiety medications is long standing anxiety and it does seem that this is associated with her palpitations. She denies any cardiac sounding chest pain.  [name] had an echocardiogram which showed a preserved EF of 55-60% and no evidence for heart failure. Her weight has remained the same and her swelling has improved. She seems to be tolerating atenolol better than her nonselective beta blocker. Also by decreasing her clonazepam and some of her other sedating medications she is remarkably more awake. She is still complaining of fatigue. She says she feels like she can sleep all the time. She reports intolerance to hot weather.  Finally, she says  she has breakthrough palpitations in the afternoon and has been using an extra half tablet of atenolol in the afternoon.  She was recently seen in emergency department for upper abdominal pain and it was determined that this was noncardiac. She continues to have similar symptoms that she is presented with in the past including fatigue. Blood pressure is noted to be low normal today at 101/75. She did bring in blood pressure readings from home though that show her blood pressures are in the 80s and sometimes even the 55D and the systolic. She does feel dizzy with this.  [name] is again seen in emergency department minutes at [facility]. She had some loss of power, near-syncope and leg wraps. She felt like she couldn't breathe. In the interim she's been evaluated by rheumatologist and diagnosed with fibromyalgia. She continues to have episodes of inappropriate tachycardia. I also suspect she is having orthostatic hypotension causing her symptomatic episodes of dizziness and near syncope.  I saw Dorna back in the office today. Since her last visit I placed her on Midrin and she's had some improvement in her blood pressures. At times she seemed blood pressures at high as 112 over 80s. She is only had one episode of inappropriate tachycardia requiring a full dose atenolol. She has felt a little more fatigue which may be related to starting gabapentin. She also feels a little nauseated. It may be beneficial to move that dose to the evening, however I defer to her prior care provider.  PMHx:  Past Medical History  Diagnosis Date  . Ovarian cancer   . Anxiety   . Rapid heart rate   . Migraine   . Depression   . Stress incontinence   . SVT (supraventricular tachycardia)     Past Surgical History  Procedure Laterality Date  . Ovary surgery    . Cholecystectomy    . Abdominal hysterectomy    . Mouth surgery      stone  . Bladder tack    . Foot surgery      FAMHx:  Family History  Problem  Relation Age of Onset  . Hypertension Mother   . Hypertension Father     SOCHx:   reports that she has been smoking Cigarettes.  She has a 2.5 pack-year smoking history. She does not have any smokeless tobacco history on file. She reports that she does not drink alcohol or use illicit drugs.  ALLERGIES:  Allergies  Allergen Reactions  . Avelox [Moxifloxacin Hcl In Nacl] Palpitations  . Iodine Swelling and Other (See Comments)    Sensation of internal heat  . Quinolones Palpitations  . Red Dye Swelling    ROS: A comprehensive review of systems was negative except for: Constitutional: positive for fatigue Respiratory: positive for dyspnea on exertion Cardiovascular: positive for near-syncope and palpitations Behavioral/Psych: positive for anxiety and fibromyalgia  HOME MEDS: Current Outpatient Prescriptions  Medication Sig Dispense Refill  . albuterol (PROVENTIL HFA;VENTOLIN HFA) 108 (90 BASE) MCG/ACT inhaler Inhale 2 puffs into the lungs every 4 (four) hours as needed for wheezing or shortness of breath.    Marland Kitchen aspirin 81 MG tablet Take 81 mg by mouth daily.      Marland Kitchen atenolol (TENORMIN) 25 MG tablet Take 12.5 mg by mouth as needed (palpitations, heart racing).    . Biotin 5 MG CAPS Take 1 capsule by mouth daily.    . cephALEXin (KEFLEX) 500 MG capsule as directed.  0  . clonazePAM (KLONOPIN) 2 MG tablet Take 2 mg by mouth daily.     . Cyanocobalamin (B-12 PO) Take by mouth.    . Fluticasone Furoate-Vilanterol (BREO ELLIPTA) 100-25 MCG/INH AEPB Inhale into the lungs as needed.    . gabapentin (NEURONTIN) 100 MG capsule Take 100 mg by mouth 2 (two) times daily.  0  . meclizine (ANTIVERT) 25 MG tablet Take 25 mg by mouth 3 (three) times daily as needed for dizziness.    . midodrine (PROAMATINE) 10 MG tablet Take 1 tablet (10 mg total) by mouth 3 (three) times daily. 90 tablet 6  . mirabegron ER (MYRBETRIQ) 50 MG TB24 tablet Take 50 mg by mouth daily.    . Omega-3 Fatty Acids (FISH OIL)  1200 MG CAPS Take 2 capsules by mouth 2 (two) times daily.     . pantoprazole (PROTONIX) 40 MG tablet Take 40 mg by mouth 2 (two) times daily.    . pravastatin (PRAVACHOL) 20 MG tablet Take by mouth daily.      Marland Kitchen zolpidem (AMBIEN) 10 MG tablet Take 10 mg by mouth at bedtime as needed.       No current facility-administered medications for this visit.    LABS/IMAGING: No results found for this or any previous visit (from the past 48 hour(s)). No results found.  VITALS: BP 94/68 mmHg  Pulse 76  Ht 5\' 2"  (1.575 m)  Wt 149 lb 11.2 oz (67.903 kg)  BMI 27.37 kg/m2  EXAM: Deferred  EKG: deferred  ASSESSMENT: 1. PSVT or ?inappropriate sinus tachycardia -  controlled on prn atenolol 2. Dyspnea on exertion - EF 55-60% 3. Stable weight 4. Dyslipidemia 5. Persistent fatigue 6. Fibromyalgia 7. Orthostatic hypotension - near syncope  PLAN: 1.   Mrs. Lindor reports some improvement in blood pressures on midodrine. She is also started gabapentin and may be expressing more fatigue from that medication. It may be more appropriate to move that medication to nighttime dosing. Overall she stabilized and her tachycardia has improved. I recommend we continue her current medications and dose adjustments on her gabapentin could be made by her primary care provider. Plan to see her back in 6 months.  Pixie Casino, MD, [facility] Attending Cardiologist [facility]  Uchechi Denison C 07/10/2014, 4:06 PM

## 2014-07-18 ENCOUNTER — Telehealth: Payer: Self-pay | Admitting: Physician Assistant

## 2014-07-18 MED ORDER — MIDODRINE HCL 10 MG PO TABS: 10.0000 mg | ORAL_TABLET | Freq: Three times a day (TID) | ORAL | Status: DC

## 2014-07-18 NOTE — Telephone Encounter (Signed)
Pt called answering service on Saturday. She reports she's been sick recently and didn't realize she was nearing out of her midodrine. Her regular pharmacy isn't open on the weekends. She requested 3 day (9 tablet) supply to last her until Tuesday when she can pick them up - sent in to [facility] per pt request.  (I initially accidentally sent it in to [facility] but this is the one that's closed today. I called and left message on their voicemail instructing them to disregard short-term script and continue regular rx.)  Donovyn Guidice PA-C

## 2014-07-19 NOTE — Telephone Encounter (Signed)
Thanks

## 2014-07-21 ENCOUNTER — Telehealth: Payer: Self-pay | Admitting: Internal Medicine

## 2014-07-21 MED ORDER — ATENOLOL 25 MG PO TABS: ORAL_TABLET | ORAL | Status: DC

## 2014-07-21 NOTE — Telephone Encounter (Signed)
Returned call to patient no answer.LMTC. 

## 2014-07-21 NOTE — Telephone Encounter (Signed)
Please refill and note that she takes a full tablet 25 mg in the morning and 1/2 tablet (12.5 mg) QHS as needed.  Dr. Lemmie Evens

## 2014-07-21 NOTE — Telephone Encounter (Signed)
Pt called in wanting to clarify the directions of her Atenolol, she says that she normally takes 1 tab in the AM po and 1/2 PM prn. She says that her prescription was written for 1/2 tab po a day which is incorrect and wanted to fix that. Her pharmacy is [facility] in [place]. Please f/u  Thanks

## 2014-07-21 NOTE — Telephone Encounter (Signed)
Received call back from patient.She stated she has been taking Atenolol 25 mg every morning and takes 12.5 mg in pm if needed.Stated she wanted Dr.Hilty to know and she needs a refill.

## 2014-07-21 NOTE — Telephone Encounter (Signed)
Atenolol 25 mg 1 tablet in morning and 12.5 mg in pm if needed refill sent to pharmacy.

## 2014-08-13 ENCOUNTER — Telehealth: Payer: Self-pay | Admitting: Internal Medicine

## 2014-08-13 NOTE — Telephone Encounter (Signed)
Pt called because she has felt she's needed to go back on original dose of atenolol due to HR being up (above 100). She had been taking 25mg  AM and 12.5mg  PM if needed.  Lately, HR has been up and she has felt like a whole 25mg  dose at night has been needed.   She is also on the midodrine to prevent her pressures from bottoming out. She feels the onset of this and has generally needed to take them q4h to prevent BP from being too low (usually at 8am, 12noon, and 4pm). With these administration times, she usually feels her pressure is low again by 8 or 9pm.  She is wondering if midodrine OK to take QID.  Informed I would route question to Dr. Debara Pickett.  If this is appropriate for med management, can schedule w/ Erasmo Downer or PA as advised.

## 2014-08-13 NOTE — Telephone Encounter (Signed)
Please call,question about her Midodrine.

## 2014-08-14 NOTE — Telephone Encounter (Signed)
I talked to Dr. Debara Pickett about this and he instructed me to tell Sarah [name]. That won't be a problem , Sarah [name]. Given instructions and stated understanding of instructions

## 2014-08-14 NOTE — Telephone Encounter (Signed)
Generally we don't dose it QID .Marland Kitchen It lasts long enough. She should try to take the medicine every 8 hours .Marland Kitchen Like 8 am, 2-3 pm and 9 or 10 pm.  Dr. Lemmie Evens

## 2014-08-14 NOTE — Telephone Encounter (Signed)
Pt is calling back in stating that her diastolic number was dropping in to the 50 and wanted to see her dosage for her Midodrine needs to to be adjusted because she is about to run out and would like the new prescription called in today if it needs to be changes. Please f/u  Thanks

## 2014-08-26 IMAGING — CR DG CHEST 2V
2 series · 2 of 2 positions shown · non-contrast
Comparison: 08/30/2011

CLINICAL DATA: Chest pain radiates into the back.  History of rapid
heart rate, cholecystectomy, hysterectomy.  History of smoking,
ovarian cancer.

CHEST - 2 VIEW

[w chest pa]
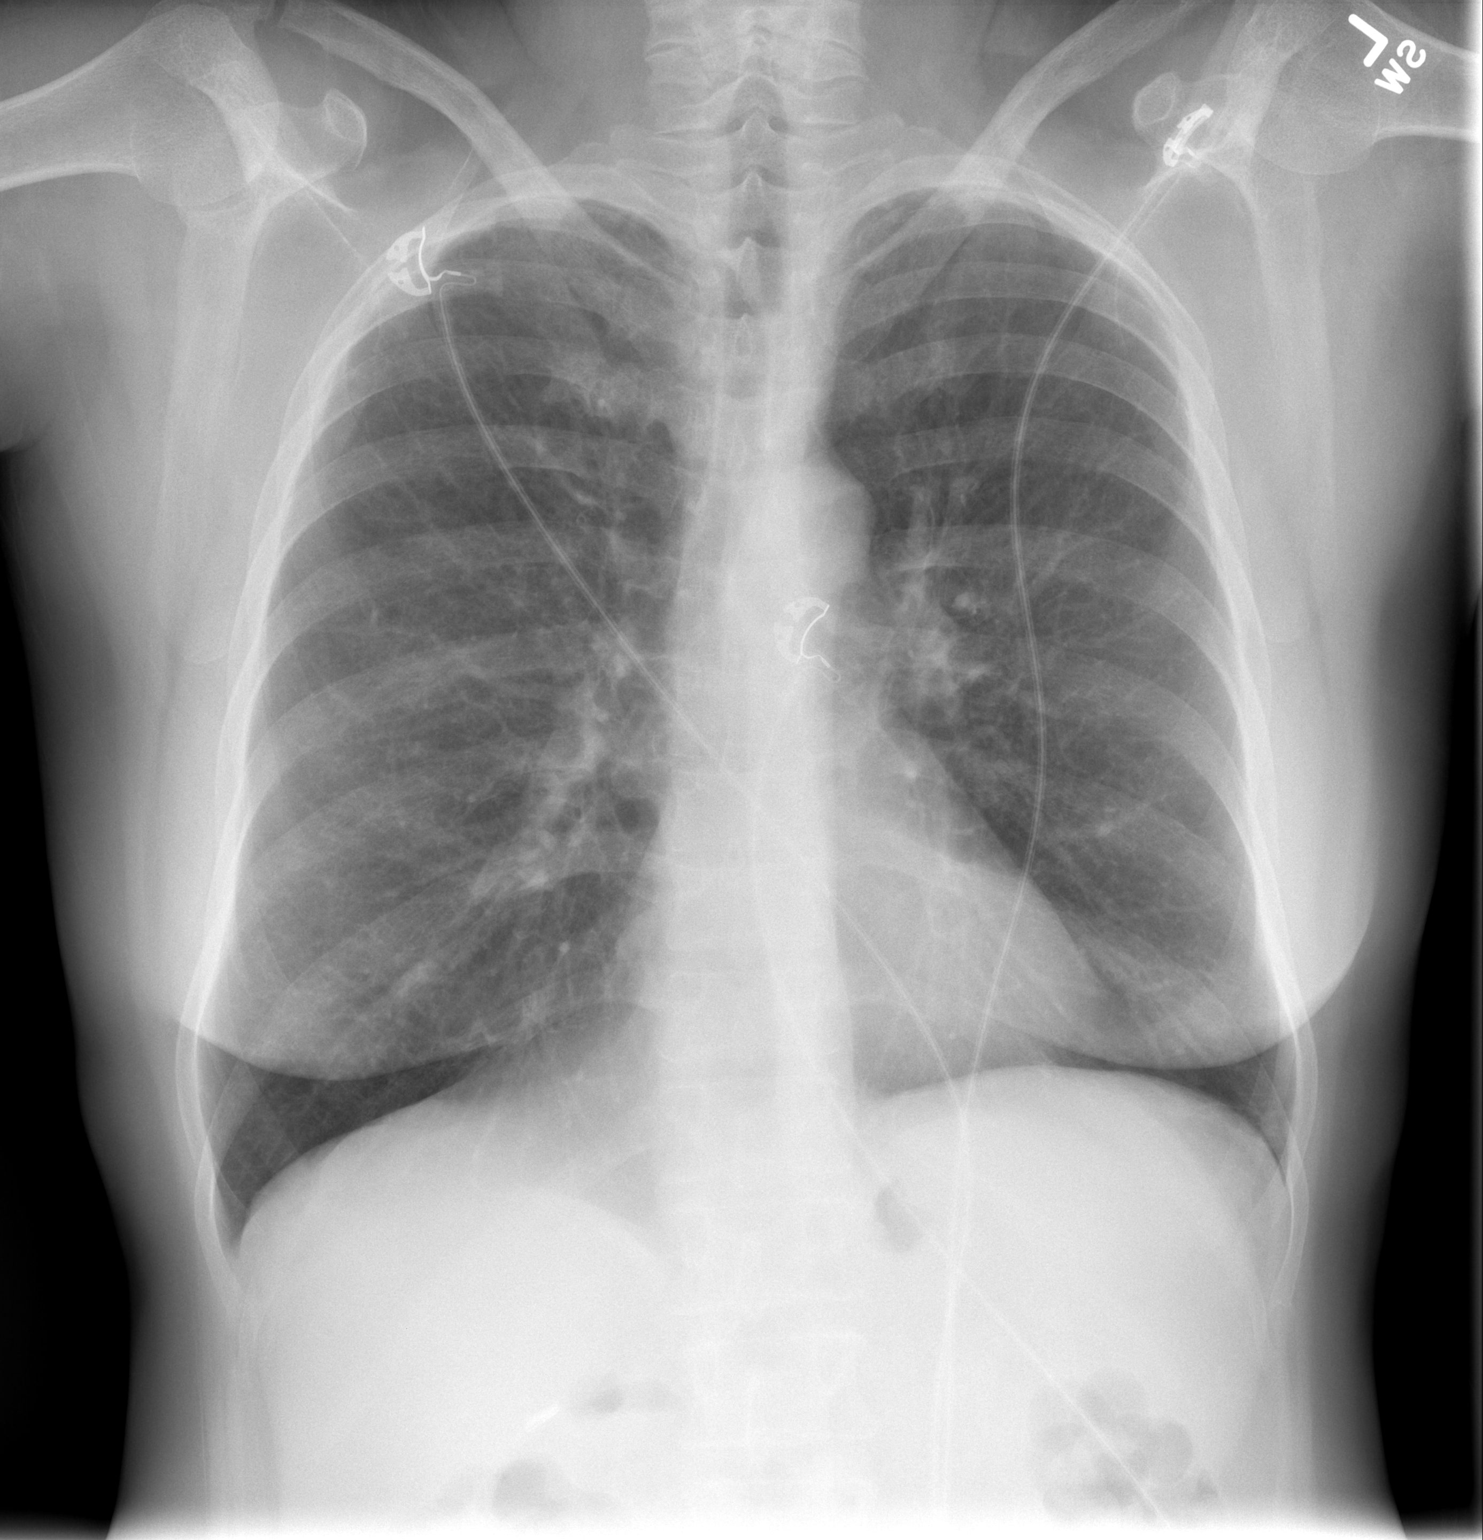

[w chest lat]
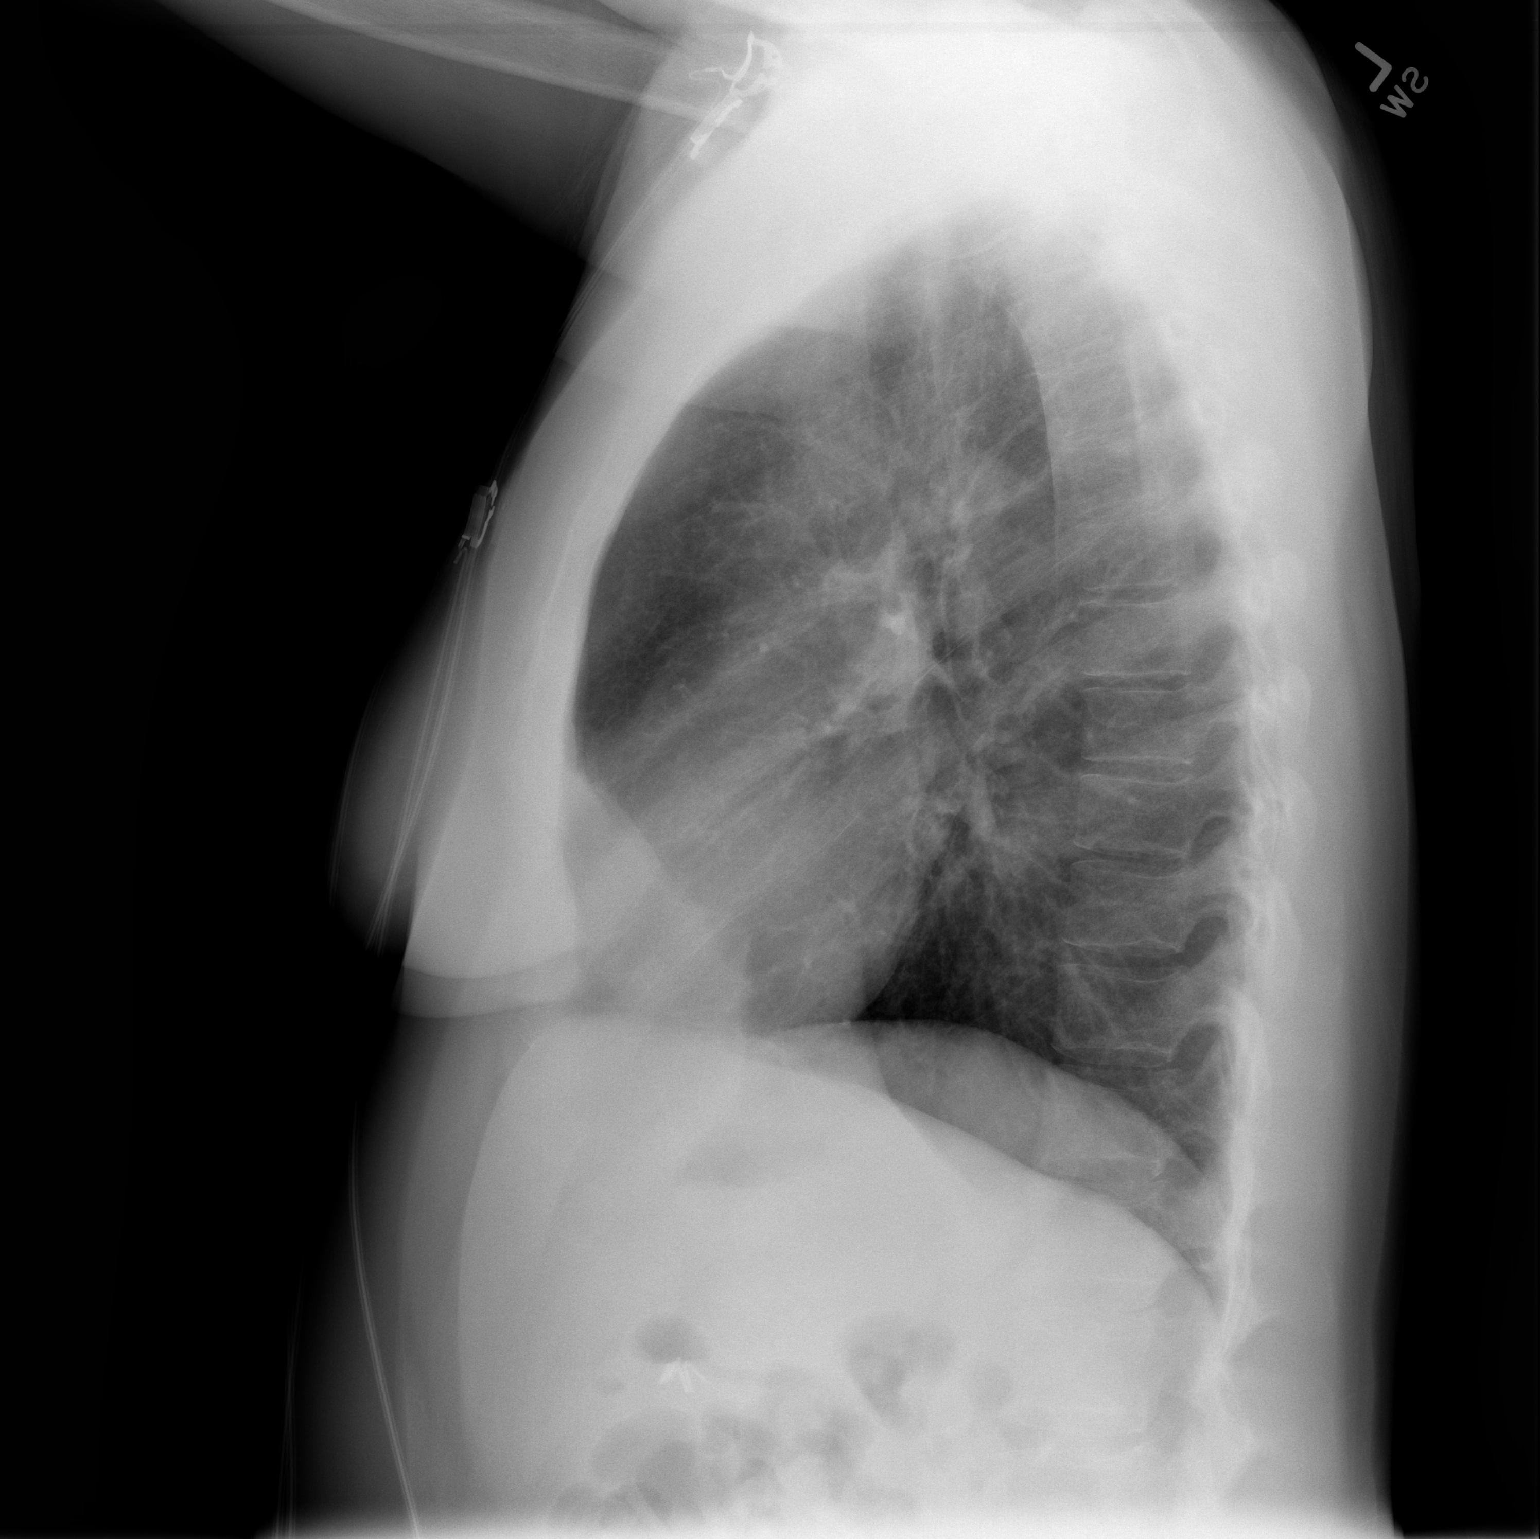

[2 of 2 positions shown; findings below may reference images not displayed]

FINDINGS: Cardiomediastinal silhouette is within normal limits.
The lungs are free of focal consolidations and pleural effusions.
No edema.  The surgical clips are identified in the right upper
quadrant of the abdomen. Visualized osseous structures have a
normal appearance.
IMPRESSION: No evidence for acute cardiopulmonary abnormality.

## 2014-09-28 ENCOUNTER — Telehealth: Payer: Self-pay | Admitting: Cardiology

## 2014-09-28 NOTE — Telephone Encounter (Signed)
Pt with bp in the 38'V systolic, has only been taking the atenolol in the AM and HR in the 70s.  She feels tired but no syncope.  I asked her to drink chicken bullion  Tonight and continue her proamitine every 8 hours.  I will send to Dr. Debara Pickett and to schedule appt for tomorrow either in [place] if space available or [address] in [place]. .  If worse she should go to ER.  She will only take 12.5 of atenolol in am

## 2014-09-29 ENCOUNTER — Encounter: Payer: Self-pay | Admitting: Physician Assistant

## 2014-09-29 ENCOUNTER — Telehealth: Payer: Self-pay | Admitting: Internal Medicine

## 2014-09-29 ENCOUNTER — Ambulatory Visit (INDEPENDENT_AMBULATORY_CARE_PROVIDER_SITE_OTHER): Payer: BLUE CROSS/BLUE SHIELD | Admitting: Physician Assistant

## 2014-09-29 VITALS — BP 106/74 | HR 69 | Ht 62.0 in | Wt 145.0 lb

## 2014-09-29 DIAGNOSIS — I471 Supraventricular tachycardia: Secondary | ICD-10-CM | POA: Diagnosis not present

## 2014-09-29 DIAGNOSIS — R5383 Other fatigue: Secondary | ICD-10-CM | POA: Diagnosis not present

## 2014-09-29 DIAGNOSIS — R000 Tachycardia, unspecified: Secondary | ICD-10-CM

## 2014-09-29 DIAGNOSIS — I951 Orthostatic hypotension: Secondary | ICD-10-CM

## 2014-09-29 DIAGNOSIS — E785 Hyperlipidemia, unspecified: Secondary | ICD-10-CM

## 2014-09-29 LAB — BASIC METABOLIC PANEL
BUN: 9 mg/dL (ref 6–23)
CO2: 31 meq/L (ref 19–32)
Calcium: 9.7 mg/dL (ref 8.4–10.5)
Chloride: 104 mEq/L (ref 96–112)
Creatinine, Ser: 0.89 mg/dL (ref 0.40–1.20)
GFR: 70.24 mL/min (ref >60.00)
Glucose, Bld: 75 mg/dL (ref 70–99)
POTASSIUM: 4 meq/L (ref 3.5–5.1)
SODIUM: 139 meq/L (ref 135–145)

## 2014-09-29 LAB — CBC WITH DIFFERENTIAL/PLATELET
BASOS PCT: 0.6 % (ref 0.0–3.0)
Basophils Absolute: 0 10*3/uL (ref 0.0–0.1)
EOS ABS: 0.1 10*3/uL (ref 0.0–0.7)
EOS PCT: 2 % (ref 0.0–5.0)
HCT: 39.5 % (ref 36.0–46.0)
Hemoglobin: 13.4 g/dL (ref 12.0–15.0)
LYMPHS ABS: 1.4 10*3/uL (ref 0.7–4.0)
Lymphocytes Relative: 21.4 % (ref 12.0–46.0)
MCHC: 34 g/dL (ref 30.0–36.0)
MCV: 81.9 fl (ref 78.0–100.0)
MONO ABS: 0.5 10*3/uL (ref 0.1–1.0)
MONOS PCT: 8 % (ref 3.0–12.0)
Neutro Abs: 4.4 10*3/uL (ref 1.4–7.7)
Neutrophils Relative %: 68 % (ref 43.0–77.0)
PLATELETS: 200 10*3/uL (ref 150.0–400.0)
RBC: 4.83 Mil/uL (ref 3.87–5.11)
RDW: 14.2 % (ref 11.5–15.5)
WBC: 6.5 10*3/uL (ref 4.0–10.5)

## 2014-09-29 LAB — TSH: TSH: 1.54 u[IU]/mL (ref 0.35–4.50)

## 2014-09-29 MED ORDER — ATENOLOL 25 MG PO TABS: ORAL_TABLET | ORAL | Status: DC

## 2014-09-29 MED ORDER — FLUDROCORTISONE ACETATE 0.1 MG PO TABS: 0.1000 mg | ORAL_TABLET | Freq: Every day | ORAL | Status: DC

## 2014-09-29 NOTE — Patient Instructions (Addendum)
Medication Instructions:  Your physician has recommended you make the following change in your medication:   1) Decrease Atenolol to 12.5 mg by mouth daily and 12.5 mg as needed  2) Start Florinef 0.1 by mouth daily  Labwork: Your physician recommends that you have labs today, BMET, CBC, and TSH  Your physician recommends that you return for lab work in: 2 weeks for BMET  Testing/Procedures: none  Follow-Up: Your physician recommends that you schedule a follow-up appointment in: 2 weeks with Dr. Debara Pickett   Any Other Special Instructions Will Be Listed Below (If Applicable).  Wear compression stocking from morning until  bedtime to reduce swelling.

## 2014-09-29 NOTE — Telephone Encounter (Signed)
Pt called in stating that when she went to go see Richardson Dopp today , he was wanting to her to now take Florinef and to cut her Atenolol dosage in half. She is a bit apprehensive and would like to Dr. Debara Pickett to approve these changes before starting to take a new medication. Please call  Thanks

## 2014-09-29 NOTE — Telephone Encounter (Signed)
Advised pt to start meds as prescribed - explained her total daily dosage of atenolol not really changed - she reports only occasionally needing the evening 1/2 tablet. Advised to monitor for changes, inform if any concerns prior to next appt. She sees Dr. Debara Pickett in 2 weeks. Pt agreeable to this plan.

## 2014-09-29 NOTE — Progress Notes (Signed)
Cardiology Office Note   Date:  09/29/2014   ID:  Sarah [name], DOB [birth year], MRN [id number]  PCP:  Beatris Si  Cardiologist:  Dr. K. Mali Hilty     Chief Complaint  Patient presents with  . Low BP     History of Present Illness: Sarah [name] is a 54 y.o. female with a hx of ovarian CA, PSVT, depression/anxiety, fibromyalgia, inappropriate tachycardia, orthostatic hypotension resulting in dizziness and near syncope, HL. She has been maintained on daily atenolol as well as needed.  She has been placed on Midodrin.  She has a lot of problems with persistent fatigue. In review of her chart, a lot of this seems to be related to medications for anxiety, depression. Last seen by Dr. Debara Pickett 07/10/14.    Patient called in last night with systolic blood pressure in the 80s. She was told to increase her salt. She was also asked to cut her atenolol in half. She is added on for further evaluation.  Over the past 2 weeks, the patient notes that she has been more tired. She notes loss of appetite. She denies any change in her medications. She denies melena, hematochezia, vomiting, diarrhea, fever, cough, wheezing. Over the last 2 days, blood pressure has been as low as 83/52. She describes increasing shortness of breath when her diastolic is in the 29J. Otherwise she does not really describe significant dyspnea with exertion. Overall, she is NYHA 2-2b. She denies chest pain. She denies orthopnea, PND or edema. She denies syncope or near-syncope. If she does not take atenolol in the morning, she has significant heart rate increase (up to 120). She is symptomatic with this.  Studies/Reports Reviewed Today:  Lexiscan Myoview 06/30/14 Overall Impression: Normal stress nuclear study. LV Wall Motion: NL LV Function; NL Wall Motion; LVEF 62%  Echo 06/10/13 - EF 50% to 55%. Wall motion was normal.  Grade 1 diastolic dysfunction.  - Aortic valve: Trileaflet; mildly thickened leaflets.  Noregurgitation. - Mitral valve: No regurgitation. - Left atrium: The atrium was normal in size. - Right ventricle: Systolic function was normal. Systolic pressure was within the normal range. - Tricuspid valve: No regurgitation. - Impressions: Impaired relaxation with no evidence forelevated filling pressures. Otherwise normal study. Impressions:  Impaired relaxation with no evidence for elevated filling pressures. Otherwise normal study.    Past Medical History  Diagnosis Date  . Ovarian cancer   . Anxiety   . Rapid heart rate   . Migraine   . Depression   . Stress incontinence   . SVT (supraventricular tachycardia)     Past Surgical History  Procedure Laterality Date  . Ovary surgery    . Cholecystectomy    . Abdominal hysterectomy    . Mouth surgery      stone  . Bladder tack    . Foot surgery       Current Outpatient Prescriptions  Medication Sig Dispense Refill  . albuterol (PROVENTIL HFA;VENTOLIN HFA) 108 (90 BASE) MCG/ACT inhaler Inhale 2 puffs into the lungs every 4 (four) hours as needed for wheezing or shortness of breath.    Marland Kitchen aspirin 81 MG tablet Take 81 mg by mouth daily.      Marland Kitchen atenolol (TENORMIN) 25 MG tablet Take 25 mg in am and take 12.5 mg in pm if needed 30 tablet 6  . Biotin 5 MG CAPS Take 1 capsule by mouth daily.    . cephALEXin (KEFLEX) 500 MG capsule as directed.  0  .  ciprofloxacin (CIPRO) 500 MG tablet 2 (two) times daily. FOR 5 DAYS  0  . clonazePAM (KLONOPIN) 2 MG tablet Take 2 mg by mouth daily.     . Cyanocobalamin (B-12 PO) Take by mouth.    Marland Kitchen FLUoxetine (PROZAC) 20 MG capsule Take 20 mg by mouth every morning.  0  . Fluticasone Furoate-Vilanterol (BREO ELLIPTA) 100-25 MCG/INH AEPB Inhale into the lungs as needed.    . meclizine (ANTIVERT) 25 MG tablet Take 25 mg by mouth 3 (three) times daily as needed for dizziness.    . midodrine (PROAMATINE) 10 MG tablet Take 1 tablet (10 mg total) by mouth 3 (three) times daily. 9 tablet 0  .  mirabegron ER (MYRBETRIQ) 50 MG TB24 tablet Take 50 mg by mouth daily.    . Omega-3 Fatty Acids (FISH OIL) 1200 MG CAPS Take 2 capsules by mouth 2 (two) times daily.     . pantoprazole (PROTONIX) 40 MG tablet Take 40 mg by mouth 2 (two) times daily.    . pravastatin (PRAVACHOL) 20 MG tablet Take by mouth daily.      Marland Kitchen zolpidem (AMBIEN) 10 MG tablet Take 10 mg by mouth at bedtime as needed.       No current facility-administered medications for this visit.    Allergies:   Avelox; Iodine; Quinolones; and Red dye    Social History:  The patient  reports that she has been smoking Cigarettes.  She has a 2.5 pack-year smoking history. She does not have any smokeless tobacco history on file. She reports that she does not drink alcohol or use illicit drugs.   Family History:  The patient's family history includes Hypertension in her father and mother.    ROS:   Please see the history of present illness.   Review of Systems  Constitution: Positive for decreased appetite and malaise/fatigue.  HENT: Positive for headaches.   Cardiovascular: Positive for dyspnea on exertion and irregular heartbeat.  Musculoskeletal: Positive for joint pain and myalgias.  Neurological: Positive for loss of balance.  Psychiatric/Behavioral: Positive for depression. The patient is nervous/anxious.   All other systems reviewed and are negative.     PHYSICAL EXAM: VS:  BP 106/74 mmHg  Pulse 69  Ht 5\' 2"  (1.575 m)  Wt 145 lb (65.772 kg)  BMI 26.51 kg/m2   Orthostatic VS for the past 24 hrs:  BP- Lying Pulse- Lying BP- Sitting Pulse- Sitting BP- Standing at 0 minutes Pulse- Standing at 0 minutes  09/29/14 1214 106/74 mmHg 70 109/75 mmHg 74 102/75 mmHg 81    Wt Readings from Last 3 Encounters:  09/29/14 145 lb (65.772 kg)  07/10/14 149 lb 11.2 oz (67.903 kg)  06/30/14 155 lb (70.308 kg)     GEN: Well nourished, well developed, in no acute distress HEENT: normal Neck: no JVD,   no masses Cardiac:   Normal S1/S2, RRR; no murmur , no rubs or gallops, no edema  Respiratory:  clear to auscultation bilaterally, no wheezing, rhonchi or rales. GI: soft, nontender, nondistended, + BS MS: no deformity or atrophy Skin: warm and dry  Neuro:  CNs II-XII intact, Strength and sensation are intact Psych: Normal affect   EKG:  EKG is ordered today.  It demonstrates:   NSR, HR 69, normal axis, T-wave inversions in V1-V3, no significant change when compared to prior tracings   Recent Labs: 04/18/2014: ALT 18; BUN 10; Creatinine 0.90; Hemoglobin 12.8; Platelets 148*; Potassium 3.9; Sodium 141    Lipid Panel No  results found for: CHOL, TRIG, HDL, CHOLHDL, VLDL, LDLCALC, LDLDIRECT    ASSESSMENT AND PLAN:  Orthostatic hypotension with associated Inappropriate sinus tachycardia Patient does have history of orthostatic hypotension. Recently, her blood pressures have been somewhat lower. Although, she does not have symptoms of near-syncope or syncope. She does have symptomatic tachycardia when she stands. This is typically corrected by taking atenolol. Blood pressure did respond well to high salt load last night. She was instructed to do this by the nurse practitioner on call who spoke with her. Orthostatic vital signs today are unremarkable. She has taken her atenolol as well as her Midodrine.  She has not tried Florinef in the past. She also does not wear compression stockings. I reviewed her case today with Dr. Johnsie Cancel (DOD). We considered starting Florinef versus Northera. We also considered whether or not to refer her to Dr. Caryl Comes for further evaluation and management.  -  Continue midodrine  -  Obtain compression stockings over-the-counter and wear from morning to night  -  Start Florinef 0.1 mg QD  -  Decrease Atenolol to 12.5 mg QD.  She may take 12.5 mg prn for palpitations as well.   -  Labs:  BMET, CBC, TSH.  Repeat BMET 2 weeks.   PSVT (paroxysmal supraventricular tachycardia) Likely related  to the above (sinus tachycardia and not SVT).    Dyslipidemia  Continue statin.   Fatigue Etiology not clear.  Possibly related to BP but as noted, no syncope or near syncope.  Check BMET, TSH, CBC as noted.   Current medicines are reviewed at length with the patient today.  Concerns regarding medicines are as outlined above.  The following changes have been made:    Decrease Atenolol 12.5 mg QD  Start Florinef 0.1 mg QD   Labs/ tests ordered today include:  Orders Placed This Encounter  Procedures  . Basic Metabolic Panel (BMET)  . Basic Metabolic Panel (BMET)  . TSH  . CBC with Differential  . EKG 12-Lead    Disposition:   FU with Dr. Raliegh Ip. Mali Hilty 2-3 weeks.  Refer to Dr. Virl Axe if Dr. Debara Pickett agrees.   Signed, Versie Starks, MHS 09/29/2014 12:09 PM    [facility] [address], [place], [place]  [address] Phone: [phone]; Fax: [phone]

## 2014-10-10 ENCOUNTER — Telehealth: Payer: Self-pay | Admitting: Internal Medicine

## 2014-10-10 NOTE — Telephone Encounter (Signed)
Sarah [name] is a 54 year old female with history of possible inappropriate sinus tachycardia and orthostatic hypotension contacted the on-call cardiology service today to inquire about a new medication given by her primary care provider. Patient has history of fibromyalgia and was recently taken off of Prozac and was asked to start nortriptyline. After reading the side effects which included orthostatic hypotension, patient contacted the on-call service. I informed the patient that it certainly possible that this medication may induce orthostatic hypotension but patient specific effect is difficult to predict. Patient has an appointment with Dr. Debara Pickett on Wednesday. I informed that it's probably safe to wait until then to discuss the risk-benefit profile with Dr. Debara Pickett.

## 2014-10-14 ENCOUNTER — Ambulatory Visit (INDEPENDENT_AMBULATORY_CARE_PROVIDER_SITE_OTHER): Payer: BLUE CROSS/BLUE SHIELD | Admitting: Internal Medicine

## 2014-10-14 ENCOUNTER — Encounter: Payer: Self-pay | Admitting: Internal Medicine

## 2014-10-14 VITALS — BP 100/58 | HR 60 | Ht 62.0 in | Wt 146.0 lb

## 2014-10-14 DIAGNOSIS — R000 Tachycardia, unspecified: Secondary | ICD-10-CM

## 2014-10-14 DIAGNOSIS — F419 Anxiety disorder, unspecified: Secondary | ICD-10-CM | POA: Diagnosis not present

## 2014-10-14 DIAGNOSIS — M797 Fibromyalgia: Secondary | ICD-10-CM | POA: Diagnosis not present

## 2014-10-14 DIAGNOSIS — I951 Orthostatic hypotension: Secondary | ICD-10-CM | POA: Diagnosis not present

## 2014-10-14 DIAGNOSIS — I4711 Inappropriate sinus tachycardia, so stated: Secondary | ICD-10-CM

## 2014-10-14 DIAGNOSIS — I471 Supraventricular tachycardia: Secondary | ICD-10-CM | POA: Diagnosis not present

## 2014-10-14 NOTE — Patient Instructions (Signed)
Your physician recommends that you schedule a follow-up appointment in: 3 months with Dr. Hilty.  

## 2014-10-14 NOTE — Progress Notes (Signed)
OFFICE NOTE  Chief Complaint:  Fatigue, sleepiness, pain, irritability  Primary Care Physician: Corine Shelter, PA-C  HPI:  Sarah [name] is a 54 year old female patient whose husband is also my patient. She presents for the first time for evaluation of weight gain. She says that she's gained 8-10 pounds over the past 2 weeks and then 8-10 pounds prior to that. Despite this she denies eating more food, but has had no lower extremity swelling, orthopnea, PND or other heart failure symptoms. She does report shortness of breath however which is chronic and not necessarily associated with exertion. She has some chronic complaints of fatigue and actually appears to be on quite a few medications. She denies taking both clonazepam and diazepam, but they're both on her medicine list. Her dose of the clonazepam is 2 mg twice daily. She additionally takes Ambien 10 mg every night along with Remeron and Inderal 10 mg 3 times daily. The accommodation of all these medications can be extremely sedating and it appears that she is quite sedate today. She does report a history of SVT in the past and has been in the emergency room numerous times for this. She also tells me that she is planning to see a thyroid specialist and may have some issue with her thyroid. The reason for a number of her anti-anxiety medications is long standing anxiety and it does seem that this is associated with her palpitations. She denies any cardiac sounding chest pain.  [name] had an echocardiogram which showed a preserved EF of 55-60% and no evidence for heart failure. Her weight has remained the same and her swelling has improved. She seems to be tolerating atenolol better than her nonselective beta blocker. Also by decreasing her clonazepam and some of her other sedating medications she is remarkably more awake. She is still complaining of fatigue. She says she feels like she can sleep all the time. She reports intolerance to hot  weather.  Finally, she says she has breakthrough palpitations in the afternoon and has been using an extra half tablet of atenolol in the afternoon.  She was recently seen in emergency department for upper abdominal pain and it was determined that this was noncardiac. She continues to have similar symptoms that she is presented with in the past including fatigue. Blood pressure is noted to be low normal today at 101/75. She did bring in blood pressure readings from home though that show her blood pressures are in the 80s and sometimes even the 43P and the systolic. She does feel dizzy with this.  [name] is again seen in emergency department minutes at [facility]. She had some loss of power, near-syncope and leg wraps. She felt like she couldn't breathe. In the interim she's been evaluated by rheumatologist and diagnosed with fibromyalgia. She continues to have episodes of inappropriate tachycardia. I also suspect she is having orthostatic hypotension causing her symptomatic episodes of dizziness and near syncope.  I saw [name] back in the office today. Since her last visit I placed her on Midrin and she's had some improvement in her blood pressures. At times she seemed blood pressures at high as 112 over 80s. She is only had one episode of inappropriate tachycardia requiring a full dose atenolol. She has felt a little more fatigue which may be related to starting gabapentin. She also feels a little nauseated. It may be beneficial to move that dose to the evening, however I defer to her prior care provider.  [name] again returns for follow-up. In the interim she was seen in the flex clinic by Richardson Dopp, PA-C. After discussion with the doctor of the day at [facility], was recommended that she start on Florinef for ongoing hypotension. This is in addition to her Midrin which she is taking 3 times a day. She brought me a list of blood pressures which she had been taking every hour for several days.  This demonstrates consistently low blood pressure between 80-100. It does not seem that the Florinef is yet made any difference in her blood pressure, although she's been on it only for 2 weeks. She continues to feel fatigued and have problems with depression and anxiety. She's recently, off of one of her medications and her primary care provider is again trying to adjust her medicines to treat her fibromyalgia. The good news is that her heart rate is generally been well controlled.  PMHx:  Past Medical History  Diagnosis Date  . Ovarian cancer   . Anxiety   . Rapid heart rate   . Migraine   . Depression   . Stress incontinence   . SVT (supraventricular tachycardia)     Past Surgical History  Procedure Laterality Date  . Ovary surgery    . Cholecystectomy    . Abdominal hysterectomy    . Mouth surgery      stone  . Bladder tack    . Foot surgery      FAMHx:  Family History  Problem Relation Age of Onset  . Hypertension Mother   . Hypertension Father   . Heart attack Paternal Grandfather   . Heart attack Maternal Uncle   . Stroke Paternal Grandmother     SOCHx:   reports that she has been smoking Cigarettes.  She has a 2.5 pack-year smoking history. She does not have any smokeless tobacco history on file. She reports that she does not drink alcohol or use illicit drugs.  ALLERGIES:  Allergies  Allergen Reactions  . Avelox [Moxifloxacin Hcl In Nacl] Palpitations  . Iodine Swelling and Other (See Comments)    Sensation of internal heat  . Quinolones Palpitations  . Red Dye Swelling    ROS: A comprehensive review of systems was negative except for: Constitutional: positive for fatigue Respiratory: positive for dyspnea on exertion Cardiovascular: positive for near-syncope and palpitations Behavioral/Psych: positive for anxiety and fibromyalgia  HOME MEDS: Current Outpatient Prescriptions  Medication Sig Dispense Refill  . albuterol (PROVENTIL HFA;VENTOLIN HFA) 108  (90 BASE) MCG/ACT inhaler Inhale 2 puffs into the lungs every 4 (four) hours as needed for wheezing or shortness of breath.    Marland Kitchen aspirin 81 MG tablet Take 81 mg by mouth daily.      Marland Kitchen atenolol (TENORMIN) 25 MG tablet Take 12.5 mg in am and take 12.5 mg in pm if needed 30 tablet 6  . Biotin 5 MG CAPS Take 1 capsule by mouth daily.    . clonazePAM (KLONOPIN) 2 MG tablet Take 2 mg by mouth daily.     . Cyanocobalamin (B-12 PO) Take 1 tablet by mouth daily. SUPER B-COMPLEX    . fludrocortisone (FLORINEF) 0.1 MG tablet Take 1 tablet (0.1 mg total) by mouth daily. 30 tablet 5  . Fluticasone Furoate-Vilanterol (BREO ELLIPTA) 100-25 MCG/INH AEPB Inhale into the lungs as needed.    . meclizine (ANTIVERT) 25 MG tablet Take 25 mg by mouth 3 (three) times daily as needed for dizziness.    . midodrine (PROAMATINE) 10 MG  tablet Take 1 tablet (10 mg total) by mouth 3 (three) times daily. 9 tablet 0  . mirabegron ER (MYRBETRIQ) 50 MG TB24 tablet Take 50 mg by mouth daily.    . Multiple Vitamin (MULTIVITAMIN) tablet Take 1 tablet by mouth daily. ALIVE WOMEN'S ENERGY    . Omega-3 Fatty Acids (FISH OIL) 1200 MG CAPS Take 2 capsules by mouth 2 (two) times daily.     . pantoprazole (PROTONIX) 40 MG tablet Take 40 mg by mouth 2 (two) times daily.    . pravastatin (PRAVACHOL) 20 MG tablet Take by mouth daily.      Marland Kitchen zolpidem (AMBIEN) 10 MG tablet Take 10 mg by mouth at bedtime as needed.       No current facility-administered medications for this visit.    LABS/IMAGING: No results found for this or any previous visit (from the past 48 hour(s)). No results found.  VITALS: BP 100/58 mmHg  Pulse 60  Ht 5\' 2"  (1.575 m)  Wt 146 lb (66.225 kg)  BMI 26.70 kg/m2  EXAM: GEN: Awake, NAD HEENT: PERRLA, EOMI Lungs: Clear bilaterally Cardiovascular: RRR, NL S1-S2, no M/R/G's Abdomen: Soft, nontender Extremity: No edema Neurologic: Follows commands, no focal findings Psychiatric: Depressed mood, flat  affect  EKG: deferred  ASSESSMENT: 1. 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 Dyspnea on exertion - EF 55-60% 3. Stable weight 4. Dyslipidemia 5. Persistent fatigue 6. Fibromyalgia 7. Orthostatic hypotension - near syncope  PLAN: 1.   Mrs. Poirier continues to struggle with orthostatic hypotension and is had inappropriate sinus tachycardia which seems to be controlled on low-dose atenolol. Her main issues seem to deal more with depression, irritability and mood lability. She cannot seem to find a good medication to help her with this. In addition she's had trouble controlling her fibromyalgia. I think she could benefit from seeing a psychiatrist who could help further adjust her medications. I've encouraged her to discuss this with her primary care provider for referral. From my standpoint, she is currently on all the medications we would treat patients with who have orthostatic hypotension and if she continues to become symptomatic, she may need a referral to specialty center. There is a fibromyalgia specialist center at [facility] which could be an option. I mentioned referral, but they wish to start with seeing a psychiatrist first. I'll plan to see her back in 3 months.  Pixie Casino, MD, [facility] Attending Cardiologist [place] 10/14/2014, 2:37 PM

## 2014-11-24 ENCOUNTER — Encounter: Payer: Self-pay | Admitting: Internal Medicine

## 2014-11-24 ENCOUNTER — Telehealth: Payer: Self-pay | Admitting: Internal Medicine

## 2014-11-25 NOTE — Telephone Encounter (Signed)
Close encounter 

## 2014-12-28 ENCOUNTER — Other Ambulatory Visit: Payer: Self-pay | Admitting: Internal Medicine

## 2014-12-28 NOTE — Telephone Encounter (Signed)
Rx(s) sent to pharmacy electronically.  

## 2015-01-13 ENCOUNTER — Ambulatory Visit (INDEPENDENT_AMBULATORY_CARE_PROVIDER_SITE_OTHER): Payer: BLUE CROSS/BLUE SHIELD | Admitting: Internal Medicine

## 2015-01-13 ENCOUNTER — Encounter: Payer: Self-pay | Admitting: Internal Medicine

## 2015-01-13 VITALS — BP 102/68 | HR 79 | Ht 62.0 in | Wt 138.7 lb

## 2015-01-13 DIAGNOSIS — I951 Orthostatic hypotension: Secondary | ICD-10-CM

## 2015-01-13 DIAGNOSIS — M797 Fibromyalgia: Secondary | ICD-10-CM

## 2015-01-13 DIAGNOSIS — I471 Supraventricular tachycardia: Secondary | ICD-10-CM

## 2015-01-13 DIAGNOSIS — I4711 Inappropriate sinus tachycardia, so stated: Secondary | ICD-10-CM

## 2015-01-13 DIAGNOSIS — R000 Tachycardia, unspecified: Secondary | ICD-10-CM

## 2015-01-13 NOTE — Progress Notes (Signed)
OFFICE NOTE  Chief Complaint:  "Doing great"  Primary Care Physician: Corine Shelter, PA-C  HPI:  Sarah [name] is a 54 year old female patient whose husband is also my patient. She presents for the first time for evaluation of weight gain. She says that she's gained 8-10 pounds over the past 2 weeks and then 8-10 pounds prior to that. Despite this she denies eating more food, but has had no lower extremity swelling, orthopnea, PND or other heart failure symptoms. She does report shortness of breath however which is chronic and not necessarily associated with exertion. She has some chronic complaints of fatigue and actually appears to be on quite a few medications. She denies taking both clonazepam and diazepam, but they're both on her medicine list. Her dose of the clonazepam is 2 mg twice daily. She additionally takes Ambien 10 mg every night along with Remeron and Inderal 10 mg 3 times daily. The accommodation of all these medications can be extremely sedating and it appears that she is quite sedate today. She does report a history of SVT in the past and has been in the emergency room numerous times for this. She also tells me that she is planning to see a thyroid specialist and may have some issue with her thyroid. The reason for a number of her anti-anxiety medications is long standing anxiety and it does seem that this is associated with her palpitations. She denies any cardiac sounding chest pain.  Sarah [name] had an echocardiogram which showed a preserved EF of 55-60% and no evidence for heart failure. Her weight has remained the same and her swelling has improved. She seems to be tolerating atenolol better than her nonselective beta blocker. Also by decreasing her clonazepam and some of her other sedating medications she is remarkably more awake. She is still complaining of fatigue. She says she feels like she can sleep all the time. She reports intolerance to hot weather.  Finally, she says  she has breakthrough palpitations in the afternoon and has been using an extra half tablet of atenolol in the afternoon.  She was recently seen in emergency department for upper abdominal pain and it was determined that this was noncardiac. She continues to have similar symptoms that she is presented with in the past including fatigue. Blood pressure is noted to be low normal today at 101/75. She did bring in blood pressure readings from home though that show her blood pressures are in the 80s and sometimes even the 18E and the systolic. She does feel dizzy with this.  Sarah [name] is again seen in emergency department minutes at [facility]. She had some loss of power, near-syncope and leg wraps. She felt like she couldn't breathe. In the interim she's been evaluated by rheumatologist and diagnosed with fibromyalgia. She continues to have episodes of inappropriate tachycardia. I also suspect she is having orthostatic hypotension causing her symptomatic episodes of dizziness and near syncope.  I saw Sarah [name] back in the office today. Since her last visit I placed her on Midrin and she's had some improvement in her blood pressures. At times she seemed blood pressures at high as 112 over 80s. She is only had one episode of inappropriate tachycardia requiring a full dose atenolol. She has felt a little more fatigue which may be related to starting gabapentin. She also feels a little nauseated. It may be beneficial to move that dose to the evening, however I defer to her prior care provider.  Sarah [name]  again returns for follow-up. In the interim she was seen in the flex clinic by Richardson Dopp, PA-C. After discussion with the doctor of the day at [facility], was recommended that she start on Florinef for ongoing hypotension. This is in addition to her Midrin which she is taking 3 times a day. She brought me a list of blood pressures which she had been taking every hour for several days. This demonstrates  consistently low blood pressure between 80-100. It does not seem that the Florinef is yet made any difference in her blood pressure, although she's been on it only for 2 weeks. She continues to feel fatigued and have problems with depression and anxiety. She's recently, off of one of her medications and her primary care provider is again trying to adjust her medicines to treat her fibromyalgia. The good news is that her heart rate is generally been well controlled.  I saw Sarah [name] back in the office today. She's very pleased with how she is feeling. She recently went on a trip to [place] and has done very well with her blood pressures. She's currently taking midodrine 10-30 mg daily. She also takes fludrocortisone 0.1 mg daily. This seems to be helping with her orthostatic hypotension. Blood pressure is been very stable and heart rate remained stable on low-dose atenolol 12.5 mg which she occasionally takes an additional dose in the evening.  PMHx:  Past Medical History  Diagnosis Date  . Ovarian cancer   . Anxiety   . Rapid heart rate   . Migraine   . Depression   . Stress incontinence   . SVT (supraventricular tachycardia)     Past Surgical History  Procedure Laterality Date  . Ovary surgery    . Cholecystectomy    . Abdominal hysterectomy    . Mouth surgery      stone  . Bladder tack    . Foot surgery      FAMHx:  Family History  Problem Relation Age of Onset  . Hypertension Mother   . Hypertension Father   . Heart attack Paternal Grandfather   . Heart attack Maternal Uncle   . Stroke Paternal Grandmother     SOCHx:   reports that she has been smoking Cigarettes.  She has a 2.5 pack-year smoking history. She does not have any smokeless tobacco history on file. She reports that she does not drink alcohol or use illicit drugs.  ALLERGIES:  Allergies  Allergen Reactions  . Avelox [Moxifloxacin Hcl In Nacl] Palpitations  . Iodine Swelling and Other (See Comments)     Sensation of internal heat  . Quinolones Palpitations  . Red Dye Swelling    ROS: A comprehensive review of systems was negative.  HOME MEDS: Current Outpatient Prescriptions  Medication Sig Dispense Refill  . albuterol (PROVENTIL HFA;VENTOLIN HFA) 108 (90 BASE) MCG/ACT inhaler Inhale 2 puffs into the lungs every 4 (four) hours as needed for wheezing or shortness of breath.    Marland Kitchen aspirin 81 MG tablet Take 81 mg by mouth daily.      Marland Kitchen atenolol (TENORMIN) 25 MG tablet Take 12.5 mg in am and take 12.5 mg in pm if needed 30 tablet 6  . Biotin 5 MG CAPS Take 1 capsule by mouth daily.    . cephALEXin (KEFLEX) 500 MG capsule Take 500 mg by mouth 2 (two) times daily.    . clonazePAM (KLONOPIN) 2 MG tablet Take 1 mg by mouth 4 (four) times daily.     Marland Kitchen  Cyanocobalamin (B-12 PO) Take 1 tablet by mouth daily. SUPER B-COMPLEX    . fludrocortisone (FLORINEF) 0.1 MG tablet Take 0.1 mg by mouth daily.    . meclizine (ANTIVERT) 25 MG tablet Take 25 mg by mouth 3 (three) times daily as needed for dizziness.    . midodrine (PROAMATINE) 10 MG tablet TAKE ONE TABLET BY MOUTH THREE TIMES DAILY 90 tablet 3  . mirabegron ER (MYRBETRIQ) 50 MG TB24 tablet Take 50 mg by mouth daily.    . Multiple Vitamin (MULTIVITAMIN) tablet Take 1 tablet by mouth daily. ALIVE WOMEN'S ENERGY    . Omega-3 Fatty Acids (FISH OIL) 1200 MG CAPS Take 2 capsules by mouth 2 (two) times daily.     . pantoprazole (PROTONIX) 40 MG tablet Take 40 mg by mouth 2 (two) times daily.    . pravastatin (PRAVACHOL) 20 MG tablet Take by mouth daily.      . sertraline (ZOLOFT) 25 MG tablet Take 25 mg by mouth daily. Py taking 1 tablet by mouth every morning for 2 weeks then take 2 tablets every morning    . sucralfate (CARAFATE) 1 G tablet Take 1 g by mouth 4 (four) times daily.    . traZODone (DESYREL) 50 MG tablet Take 50 mg by mouth as needed for sleep.     No current facility-administered medications for this visit.    LABS/IMAGING: No results  found for this or any previous visit (from the past 48 hour(s)). No results found.  VITALS: BP 102/68 mmHg  Pulse 79  Ht 5\' 2"  (1.575 m)  Wt 138 lb 11.2 oz (62.914 kg)  BMI 25.36 kg/m2  EXAM: GEN: Awake, NAD HEENT: PERRLA, EOMI Lungs: Clear bilaterally Cardiovascular: RRR, NL S1-S2, no M/R/G's Abdomen: Soft, nontender Extremity: No edema Neurologic: Follows commands, no focal findings Psychiatric: Depressed mood, flat affect  EKG: Normal sinus rhythm at 79, nonspecific ST and T changes  ASSESSMENT: 1. 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 Dyspnea on exertion - EF 55-60% 3. Stable weight 4. Dyslipidemia 5. Fibromyalgia 6. Orthostatic hypotension - stable on midodrine and florinef  PLAN: 1.   Sarah [name] is very pleased with how she is doing at this point. Her blood pressure is maintained a stable number on Midrin and Florinef. She's had no other presyncopal or syncopal episodes. Heart rate seems to be well-controlled on low-dose atenolol. We'll continue her current medications and plan see her back in 6 months.  Pixie Casino, MD, [facility] Attending Cardiologist [place] 01/13/2015, 2:29 PM

## 2015-01-13 NOTE — Patient Instructions (Signed)
Your physician wants you to follow-up in: 6 Months You will receive a reminder letter in the mail two months in advance. If you don't receive a letter, please call our office to schedule the follow-up appointment.  

## 2015-01-21 ENCOUNTER — Ambulatory Visit: Payer: BLUE CROSS/BLUE SHIELD | Admitting: Internal Medicine

## 2015-01-21 NOTE — Addendum Note (Signed)
Addended by: Zebedee Iba on: 01/21/2015 05:06 PM   Modules accepted: Orders

## 2015-01-29 ENCOUNTER — Telehealth: Payer: Self-pay | Admitting: Internal Medicine

## 2015-01-29 MED ORDER — ATENOLOL 25 MG PO TABS: ORAL_TABLET | ORAL | Status: DC

## 2015-01-29 NOTE — Telephone Encounter (Signed)
Spoke with patient who states her psychiatrist has prescribed her zoloft, trazadone 40mg  (take 2 QHS) and lamotrigine 25mg  that is being titrated up. She thinks one of these medications is increasing her HR  She states she has been taking atenolol 25mg  - 1 tablet QAM and 1/2 tablet QHS as needed (this was not on our file) and now she is having to take an extra 1/2 tablet during the day as needed.  >> last refill of atenolol was in April 2016 and instructions were 1/2 tab QAM and 1/2 QHS prn.  >> patient was insistent that MD changed this dose.   >> last time atenolol dose was adjusted was April 2016 during OV with Kathleen Argue >> MD last note "Blood pressure is been very stable and heart rate remained stable on low-dose atenolol 12.5 mg which she occasionally takes an additional dose in the evening."  Medication refilled for patient.   Message routed to MD to review and advise on medications and atenolol dose discrepancy.

## 2015-01-29 NOTE — Telephone Encounter (Signed)
Let her adjust as needed - okay to take up to 25 mg BID.  Dr. Lemmie Evens

## 2015-01-29 NOTE — Telephone Encounter (Signed)
LM for patient with this info. Rx was sent in for #60 to cover up to 25mg  BID

## 2015-01-29 NOTE — Telephone Encounter (Signed)
Patient needs to discuss the dosage of her Atenolol

## 2015-01-29 NOTE — Telephone Encounter (Signed)
LMTCB

## 2015-01-30 ENCOUNTER — Other Ambulatory Visit: Payer: Self-pay | Admitting: Internal Medicine

## 2015-02-01 ENCOUNTER — Telehealth: Payer: Self-pay | Admitting: Internal Medicine

## 2015-02-01 MED ORDER — ATENOLOL 25 MG PO TABS: ORAL_TABLET | ORAL | Status: DC

## 2015-02-01 NOTE — Telephone Encounter (Signed)
April from [facility] is calling because the instructions are different from wha the patient states, They have Atenolol at 1/2 tablet twice a day and Sarah [name] is taking 25mg  in the mornig and 12.5 in the afternoon an evening. Please call  To clarify . And for insurance purposes they need a new prescription .

## 2015-02-01 NOTE — Telephone Encounter (Signed)
Phoned pharmacy, gave clarification on Dr. Lysbeth Penner new order for patient's atenolol.   Pharmacy tech voiced receipt of new Rx, no further questions.

## 2015-02-06 ENCOUNTER — Encounter (HOSPITAL_BASED_OUTPATIENT_CLINIC_OR_DEPARTMENT_OTHER): Payer: Self-pay | Admitting: *Deleted

## 2015-02-06 ENCOUNTER — Emergency Department (HOSPITAL_BASED_OUTPATIENT_CLINIC_OR_DEPARTMENT_OTHER): Payer: BLUE CROSS/BLUE SHIELD

## 2015-02-06 ENCOUNTER — Emergency Department (HOSPITAL_BASED_OUTPATIENT_CLINIC_OR_DEPARTMENT_OTHER)
Admission: EM | Admit: 2015-02-06 | Discharge: 2015-02-06 | Disposition: A | Discharge status: Home / Self Care | Payer: BLUE CROSS/BLUE SHIELD | Attending: Emergency Medicine | Admitting: Emergency Medicine

## 2015-02-06 DIAGNOSIS — Z720 Tobacco use: Secondary | ICD-10-CM | POA: Insufficient documentation

## 2015-02-06 DIAGNOSIS — Z8679 Personal history of other diseases of the circulatory system: Secondary | ICD-10-CM | POA: Insufficient documentation

## 2015-02-06 DIAGNOSIS — Z8543 Personal history of malignant neoplasm of ovary: Secondary | ICD-10-CM | POA: Insufficient documentation

## 2015-02-06 DIAGNOSIS — F329 Major depressive disorder, single episode, unspecified: Secondary | ICD-10-CM | POA: Diagnosis not present

## 2015-02-06 DIAGNOSIS — N390 Urinary tract infection, site not specified: Secondary | ICD-10-CM | POA: Diagnosis not present

## 2015-02-06 DIAGNOSIS — Z79899 Other long term (current) drug therapy: Secondary | ICD-10-CM | POA: Insufficient documentation

## 2015-02-06 DIAGNOSIS — G43909 Migraine, unspecified, not intractable, without status migrainosus: Secondary | ICD-10-CM | POA: Insufficient documentation

## 2015-02-06 DIAGNOSIS — F419 Anxiety disorder, unspecified: Secondary | ICD-10-CM | POA: Insufficient documentation

## 2015-02-06 DIAGNOSIS — I959 Hypotension, unspecified: Secondary | ICD-10-CM | POA: Diagnosis present

## 2015-02-06 DIAGNOSIS — Z792 Long term (current) use of antibiotics: Secondary | ICD-10-CM | POA: Diagnosis not present

## 2015-02-06 DIAGNOSIS — Z7982 Long term (current) use of aspirin: Secondary | ICD-10-CM | POA: Insufficient documentation

## 2015-02-06 LAB — URINALYSIS, ROUTINE W REFLEX MICROSCOPIC
Bilirubin Urine: NEGATIVE
GLUCOSE, UA: NEGATIVE mg/dL
Ketones, ur: NEGATIVE mg/dL
Nitrite: NEGATIVE
PH: 5.5 (ref 5.0–8.0)
Protein, ur: NEGATIVE mg/dL
SPECIFIC GRAVITY, URINE: 1.011 (ref 1.005–1.030)
Urobilinogen, UA: 0.2 mg/dL (ref 0.0–1.0)

## 2015-02-06 LAB — CBC WITH DIFFERENTIAL/PLATELET
BASOS PCT: 0 % (ref 0–1)
Basophils Absolute: 0 10*3/uL (ref 0.0–0.1)
Eosinophils Absolute: 0.2 10*3/uL (ref 0.0–0.7)
Eosinophils Relative: 2 % (ref 0–5)
HEMATOCRIT: 37.4 % (ref 36.0–46.0)
HEMOGLOBIN: 12.7 g/dL (ref 12.0–15.0)
LYMPHS ABS: 1.8 10*3/uL (ref 0.7–4.0)
LYMPHS PCT: 25 % (ref 12–46)
MCH: 27.9 pg (ref 26.0–34.0)
MCHC: 34 g/dL (ref 30.0–36.0)
MCV: 82 fL (ref 78.0–100.0)
MONO ABS: 0.6 10*3/uL (ref 0.1–1.0)
MONOS PCT: 9 % (ref 3–12)
NEUTROS ABS: 4.7 10*3/uL (ref 1.7–7.7)
Neutrophils Relative %: 64 % (ref 43–77)
Platelets: 186 10*3/uL (ref 150–400)
RBC: 4.56 MIL/uL (ref 3.87–5.11)
RDW: 13.8 % (ref 11.5–15.5)
WBC: 7.4 10*3/uL (ref 4.0–10.5)

## 2015-02-06 LAB — COMPREHENSIVE METABOLIC PANEL
ALBUMIN: 4.1 g/dL (ref 3.5–5.0)
ALK PHOS: 61 U/L (ref 38–126)
ALT: 10 U/L — ABNORMAL LOW (ref 14–54)
ANION GAP: 9 (ref 5–15)
AST: 17 U/L (ref 15–41)
BUN: 13 mg/dL (ref 6–20)
CALCIUM: 9.3 mg/dL (ref 8.9–10.3)
CHLORIDE: 103 mmol/L (ref 101–111)
CO2: 26 mmol/L (ref 22–32)
Creatinine, Ser: 0.77 mg/dL (ref 0.44–1.00)
GFR calc Af Amer: >60 mL/min (ref >60)
GFR calc non Af Amer: >60 mL/min (ref >60)
GLUCOSE: 127 mg/dL — AB (ref 65–99)
Potassium: 3.4 mmol/L — ABNORMAL LOW (ref 3.5–5.1)
SODIUM: 138 mmol/L (ref 135–145)
Total Bilirubin: 0.3 mg/dL (ref 0.3–1.2)
Total Protein: 6.9 g/dL (ref 6.5–8.1)

## 2015-02-06 LAB — URINE MICROSCOPIC-ADD ON

## 2015-02-06 LAB — I-STAT CG4 LACTIC ACID, ED: Lactic Acid, Venous: 1.47 mmol/L (ref 0.5–2.0)

## 2015-02-06 MED ORDER — CEFTRIAXONE SODIUM 1 G IJ SOLR
INTRAMUSCULAR | Status: AC
  Filled 2015-02-06: qty 10

## 2015-02-06 MED ORDER — CEPHALEXIN 500 MG PO CAPS: 500.0000 mg | ORAL_CAPSULE | Freq: Four times a day (QID) | ORAL | Status: DC

## 2015-02-06 MED ORDER — CEFTRIAXONE SODIUM 1 G IJ SOLR
1.0000 g | Freq: Once | INTRAMUSCULAR | Status: AC
  Administered 2015-02-06: 1 g via INTRAVENOUS

## 2015-02-06 MED ORDER — SODIUM CHLORIDE 0.9 % IV BOLUS (SEPSIS)
1000.0000 mL | Freq: Once | INTRAVENOUS | Status: AC
  Administered 2015-02-06: 1000 mL via INTRAVENOUS

## 2015-02-06 NOTE — ED Notes (Signed)
Lactic acid results given to Dr. Goldston 

## 2015-02-06 NOTE — ED Provider Notes (Signed)
CSN: [id number]     Arrival date & time 02/06/15  0150 History   First MD Initiated Contact with Patient 02/06/15 [phone]     Chief Complaint  Patient presents with  . Hypotension     (Consider location/radiation/quality/duration/timing/severity/associated sxs/prior Treatment) HPI  54 year old female presents to the ER with hypotension. Patient has intermittent issues with hypotension and has to take midodrine intermittently whenever she notices low blood pressure. Has had to take it twice tonight. She currently has her third UTI in the last couple weeks. Recently finished Keflex but then today started having UTI symptoms again this morning. Hematuria and dysuria. Was told that she has an Escherichia coli infection. Has taken to amoxicillin doses from her PCP. Denies fevers, vomiting, or back pain. Is having lower abdominal pain. Has been having some intermittent flank pain during these previous UTIs and her PCP was concerned about a possible infected stone. No imaging done yet. Feels diffusely weak but her blood pressure has come up since taking her most recent midodrine. No CP or dyspnea. Called her cardiologist who recommended coming into ER to see if she had a "bloodstream infection".  Past Medical History  Diagnosis Date  . Ovarian cancer   . Anxiety   . Rapid heart rate   . Migraine   . Depression   . Stress incontinence   . SVT (supraventricular tachycardia)    Past Surgical History  Procedure Laterality Date  . Ovary surgery    . Cholecystectomy    . Abdominal hysterectomy    . Mouth surgery      stone  . Bladder tack    . Foot surgery     Family History  Problem Relation Age of Onset  . Hypertension Mother   . Hypertension Father   . Heart attack Paternal Grandfather   . Heart attack Maternal Uncle   . Stroke Paternal Grandmother    Social History  Substance Use Topics  . Smoking status: Current Every Day Smoker -- 0.50 packs/day for 5 years    Types: Cigarettes  .  Smokeless tobacco: None     Comment: quit previously, restarted in 2007  . Alcohol Use: No   OB History    No data available     Review of Systems  Constitutional: Negative for fever.  Respiratory: Negative for shortness of breath.   Cardiovascular: Negative for chest pain.  Gastrointestinal: Positive for abdominal pain. Negative for nausea and vomiting.  Genitourinary: Positive for dysuria and hematuria.  Musculoskeletal: Negative for back pain.  Neurological: Positive for weakness.  All other systems reviewed and are negative.     Allergies  Avelox; Iodine; Quinolones; and Red dye  Home Medications   Prior to Admission medications   Medication Sig Start Date End Date Taking? Authorizing Provider  albuterol (PROVENTIL HFA;VENTOLIN HFA) 108 (90 BASE) MCG/ACT inhaler Inhale 2 puffs into the lungs every 4 (four) hours as needed for wheezing or shortness of breath.    Historical Provider, MD  aspirin 81 MG tablet Take 81 mg by mouth daily.      Historical Provider, MD  atenolol (TENORMIN) 25 MG tablet Take up to 25 mg in am and 25 mg in pm as needed. 02/01/15   Pixie Casino, MD  Biotin 5 MG CAPS Take 1 capsule by mouth daily.    Historical Provider, MD  cephALEXin (KEFLEX) 500 MG capsule Take 500 mg by mouth 2 (two) times daily.    Historical Provider, MD  clonazePAM Bobbye Charleston) 2  MG tablet Take 1 mg by mouth 4 (four) times daily.     Historical Provider, MD  Cyanocobalamin (B-12 PO) Take 1 tablet by mouth daily. SUPER B-COMPLEX    Historical Provider, MD  fludrocortisone (FLORINEF) 0.1 MG tablet Take 0.1 mg by mouth daily.    Historical Provider, MD  meclizine (ANTIVERT) 25 MG tablet Take 25 mg by mouth 3 (three) times daily as needed for dizziness.    Historical Provider, MD  midodrine (PROAMATINE) 10 MG tablet TAKE ONE TABLET BY MOUTH THREE TIMES DAILY 12/28/14   Pixie Casino, MD  mirabegron ER (MYRBETRIQ) 50 MG TB24 tablet Take 50 mg by mouth daily. 09/15/13   Historical  Provider, MD  Multiple Vitamin (MULTIVITAMIN) tablet Take 1 tablet by mouth daily. Soham    Historical Provider, MD  Omega-3 Fatty Acids (FISH OIL) 1200 MG CAPS Take 2 capsules by mouth 2 (two) times daily.     Historical Provider, MD  pantoprazole (PROTONIX) 40 MG tablet Take 40 mg by mouth 2 (two) times daily.    Historical Provider, MD  pravastatin (PRAVACHOL) 20 MG tablet Take by mouth daily.      Historical Provider, MD  sertraline (ZOLOFT) 25 MG tablet Take 25 mg by mouth daily. Py taking 1 tablet by mouth every morning for 2 weeks then take 2 tablets every morning    Historical Provider, MD  sucralfate (CARAFATE) 1 G tablet Take 1 g by mouth 4 (four) times daily.    Historical Provider, MD  traZODone (DESYREL) 50 MG tablet Take 50 mg by mouth as needed for sleep.    Historical Provider, MD   BP 126/76 mmHg  Pulse 73  Temp(Src) 97.7 F (36.5 C) (Oral)  SpO2 96% Physical Exam  Constitutional: She is oriented to person, place, and time. She appears well-developed and well-nourished.  HENT:  Head: Normocephalic and atraumatic.  Right Ear: External ear normal.  Left Ear: External ear normal.  Nose: Nose normal.  Eyes: Right eye exhibits no discharge. Left eye exhibits no discharge.  Cardiovascular: Normal rate, regular rhythm and normal heart sounds.   Pulmonary/Chest: Effort normal and breath sounds normal.  Abdominal: Soft. There is tenderness in the right lower quadrant, suprapubic area and left lower quadrant. There is no CVA tenderness.  Neurological: She is alert and oriented to person, place, and time.  Skin: Skin is warm and dry.  Nursing note and vitals reviewed.   ED Course  Procedures (including critical care time) Labs Review Labs Reviewed  COMPREHENSIVE METABOLIC PANEL - Abnormal; Notable for the following:    Potassium 3.4 (*)    Glucose, Bld 127 (*)    ALT 10 (*)    All other components within normal limits  URINALYSIS, ROUTINE W REFLEX  MICROSCOPIC (NOT AT [facility]) - Abnormal; Notable for the following:    APPearance CLOUDY (*)    Hgb urine dipstick LARGE (*)    Leukocytes, UA LARGE (*)    All other components within normal limits  URINE MICROSCOPIC-ADD ON - Abnormal; Notable for the following:    Squamous Epithelial / LPF FEW (*)    Bacteria, UA MANY (*)    All other components within normal limits  URINE CULTURE  CULTURE, BLOOD (ROUTINE X 2)  CULTURE, BLOOD (ROUTINE X 2)  CBC WITH DIFFERENTIAL/PLATELET  I-STAT CG4 LACTIC ACID, ED    Imaging Review Ct Renal Stone Study  02/06/2015   CLINICAL DATA:  Bilateral flank pain for 5 weeks.  EXAM:  CT ABDOMEN AND PELVIS WITHOUT CONTRAST  TECHNIQUE: Multidetector CT imaging of the abdomen and pelvis was performed following the standard protocol without IV contrast.  COMPARISON:  01/26/2013  FINDINGS: There is no urinary calculus. There is no hydronephrosis. There is a benign right parapelvic renal cyst measuring 4 cm. Ureters and urinary bladder are unremarkable.  There is cholecystectomy. There are unremarkable unenhanced appearances of the liver, spleen, pancreas and adrenals. Appendix is normal. Bowel is unremarkable. No acute inflammatory changes are evident in the abdomen or pelvis. There is no adenopathy. There is no ascites.  The abdominal aorta is normal in caliber with mild atherosclerotic calcification. There is no significant abnormality in the lower chest. There is no significant musculoskeletal abnormality.  IMPRESSION: 1. No acute findings are evident in the abdomen or pelvis. 2. No significant abnormality.   Electronically Signed   By: Andreas Newport M.D.   On: 02/06/2015 02:46   I have personally reviewed and evaluated these images and lab results as part of my medical decision-making.   EKG Interpretation None      MDM   Final diagnoses:  Acute UTI    Patient appears well here and has no hypotension. Multiple blood pressures been taken and all are above 824  systolic. Patient does have a UTI, given 1 dose of IV Rocephin and will be treated with Keflex. Urine sent for culture. Given hypotension at home blood cultures were obtained but I feel this hypotension is more related to her with a static hypertension that is well documented in her chart. No other signs of sepsis including no fever, elevated white blood cell count, organ dysfunction, or lactic acidosis. No signs of pyelonephritis and CT is unremarkable. Given that I do not think her hypotension is related to her UTI and with no hypotension here I feel she is stable for discharge with oral antibiotics. Discussed strict return precautions and recommend close follow-up with PCP.    Sherwood Gambler, MD 02/06/15 [phone]

## 2015-02-06 NOTE — ED Notes (Signed)
Recently DX with UTI x 3 in last 5 weeks seen PCP 09/02 dx with e-coli in urine  currently on amoxicillin x 2 doses. BP has been decreased x 18 hours. Contacted cardiologist prior to arrival for BP of 76/54 and was told to come to ED. Currently feels weak denies dizziness chest pain N/V.

## 2015-02-07 LAB — URINE CULTURE

## 2015-02-09 ENCOUNTER — Telehealth: Payer: Self-pay | Admitting: Internal Medicine

## 2015-02-09 MED ORDER — MIDODRINE HCL 10 MG PO TABS: ORAL_TABLET | ORAL | Status: DC

## 2015-02-09 NOTE — Telephone Encounter (Signed)
Infection is probably lowering her BP -- could increase the midodrine up to 20 mg TID, but would wait until she has finished antibiotics and infection resolved if she is not dizzy or symptomatic.  Dr. Lemmie Evens

## 2015-02-09 NOTE — Telephone Encounter (Signed)
Some antibiotics can lower BP, as can infection.  Also, her chart notes clonazepam qid, which does list orthostatic hypotension as a low risk side effecrt

## 2015-02-09 NOTE — Telephone Encounter (Signed)
Mrs. Sykora is calling the her blood pressure has been dropping . Please call

## 2015-02-09 NOTE — Telephone Encounter (Signed)
Dr. Lysbeth Penner recommendations explained thoroughly to patient, patient requested refill at recommended dose d/t soon leaving for out-of-town trip. Refill sent to preferred pharmacy. Advised to call for any needs. Pt verbalized understanding.

## 2015-02-09 NOTE — Telephone Encounter (Signed)
Spoke to patient regarding continuing issues w/ BP dropping. She was in ED the other night - reports her BP dropped there while on IV AB's - got as low as 78/50 before returning to baseline.  She reports longstanding recurrent UTI's - over past 5 weeks, has been on 3 different antibiotics. She notes strong correlation w/ antibiotic use and blood pressure issues.  She reports events happening at home, most recently yesterday. Symptom of lightheadedness - she denies syncope.   She reports atenolol use - currently taking 25mg  in AM and 12.5mg  in PM. She has optional 12.5mg  PRN dose which she has not required recently. She takes midodrine 10mg  TID.  Informed her I would send to Dr. Debara Pickett to advise on med changes, if OK to increase midodrine dose further. Also routing to [place] for review on potential med interactions.  Advised patient for now to cut morning dose of atenolol to 12.5mg , keep evening dose at 12.5mg , then take the 12.5mg  PRN dose if she requires for her tachycardic events. Advised to call back if worsening changes, o/w I will follow up with further recommendations from physician.  Pt agreeable to plan.

## 2015-02-11 LAB — CULTURE, BLOOD (ROUTINE X 2)
CULTURE: NO GROWTH
CULTURE: NO GROWTH

## 2015-03-17 ENCOUNTER — Telehealth: Payer: Self-pay | Admitting: Internal Medicine

## 2015-03-17 NOTE — Telephone Encounter (Signed)
Sarah [name] is calling to see if she can take Mucinex D , wants to know will it affect her Bp and her heart medication ,. Please call   Thanks

## 2015-03-17 NOTE — Telephone Encounter (Signed)
Pt calling regarding mucinex D -- please advise on interaction w/ current meds & recommended OTC meds.

## 2015-03-17 NOTE — Telephone Encounter (Signed)
Spoke with patient.  Explained that any of the OTC medications with D have a decongestant that may cause her BP to increase.  She states it has been elevated recently without any OTC medications.  Suggested she can try plain mucinex or Afrin nasal spray for no more than 3 days if she needed a decongestant.

## 2015-04-02 ENCOUNTER — Other Ambulatory Visit: Payer: Self-pay | Admitting: *Deleted

## 2015-04-02 DIAGNOSIS — I951 Orthostatic hypotension: Secondary | ICD-10-CM

## 2015-04-02 MED ORDER — FLUDROCORTISONE ACETATE 0.1 MG PO TABS: 0.1000 mg | ORAL_TABLET | Freq: Every day | ORAL | Status: DC

## 2015-04-05 ENCOUNTER — Other Ambulatory Visit (HOSPITAL_BASED_OUTPATIENT_CLINIC_OR_DEPARTMENT_OTHER): Payer: Self-pay | Admitting: Physician Assistant

## 2015-04-05 DIAGNOSIS — Z1231 Encounter for screening mammogram for malignant neoplasm of breast: Secondary | ICD-10-CM

## 2015-04-08 ENCOUNTER — Telehealth: Payer: Self-pay | Admitting: Internal Medicine

## 2015-04-08 ENCOUNTER — Ambulatory Visit (HOSPITAL_BASED_OUTPATIENT_CLINIC_OR_DEPARTMENT_OTHER)
Admission: RE | Admit: 2015-04-08 | Discharge: 2015-04-08 | Disposition: A | Discharge status: Home / Self Care | Payer: BLUE CROSS/BLUE SHIELD | Source: Ambulatory Visit | Attending: Physician Assistant | Admitting: Physician Assistant

## 2015-04-08 DIAGNOSIS — Z1231 Encounter for screening mammogram for malignant neoplasm of breast: Secondary | ICD-10-CM | POA: Diagnosis not present

## 2015-04-08 NOTE — Telephone Encounter (Signed)
Patient states her PCP wants to put her on potassium --she wants to know if you think this is ok.

## 2015-04-08 NOTE — Telephone Encounter (Signed)
Pt wanted to know if Ok to take Potassium Chloride for 5 days at PCP recommendation for serum K+ of 3.4  She is getting f/u bloodwork.  Advised OK, call if problems - pt verbalized understanding.

## 2015-04-27 ENCOUNTER — Telehealth: Payer: Self-pay | Admitting: Internal Medicine

## 2015-04-27 NOTE — Telephone Encounter (Signed)
Spoke with pt, aware will be okay to take. She reports it has caused her to have low bp and her heart rate is fluctuating. Advised pt to stop the medicine and to get in touch with the provider to have it changed. Pt agreed with this plan.

## 2015-04-27 NOTE — Telephone Encounter (Signed)
Sarah [name] is calling because she wants to know if she take Sulsameth/Trimethoprim 800/160mg   She is in [place] and had to go to the hospital for a UTI. Please call   Thanks

## 2015-04-27 NOTE — Telephone Encounter (Signed)
Will forward top pharm md, kristin to review

## 2015-04-27 NOTE — Telephone Encounter (Signed)
Sulfameth/trimethoprim will be fine with her medications

## 2015-05-10 ENCOUNTER — Telehealth: Payer: Self-pay | Admitting: Internal Medicine

## 2015-05-10 NOTE — Telephone Encounter (Signed)
Accidentally closed.  Any concern for interactions w/ premarin and listed medications?

## 2015-05-10 NOTE — Telephone Encounter (Signed)
Patient states she went to see a Urologist today and he wants her to start taking Premarin.  She is wondering about the side effects and the interaction with her other medications.  Please call.

## 2015-05-10 NOTE — Telephone Encounter (Signed)
Recommendations communicated to patient and indications for estrogen patch vs premarin given. Pt verbalized understanding.

## 2015-05-10 NOTE — Telephone Encounter (Signed)
No problems with drug interactions.  We don't have lipid labs, but she is on fish oil and pravastatin - premarin can cause an increase in triglycerides.  If that could be a problem for her would recommend an estrogen patch instead - patches don't affect trig numbers.

## 2015-05-29 ENCOUNTER — Emergency Department (HOSPITAL_BASED_OUTPATIENT_CLINIC_OR_DEPARTMENT_OTHER)
Admission: EM | Admit: 2015-05-29 | Discharge: 2015-05-29 | Disposition: A | Discharge status: Home / Self Care | Payer: BLUE CROSS/BLUE SHIELD | Attending: Emergency Medicine | Admitting: Emergency Medicine

## 2015-05-29 ENCOUNTER — Emergency Department (HOSPITAL_BASED_OUTPATIENT_CLINIC_OR_DEPARTMENT_OTHER): Payer: BLUE CROSS/BLUE SHIELD

## 2015-05-29 ENCOUNTER — Encounter (HOSPITAL_BASED_OUTPATIENT_CLINIC_OR_DEPARTMENT_OTHER): Payer: Self-pay | Admitting: Emergency Medicine

## 2015-05-29 DIAGNOSIS — F329 Major depressive disorder, single episode, unspecified: Secondary | ICD-10-CM | POA: Diagnosis not present

## 2015-05-29 DIAGNOSIS — S93402A Sprain of unspecified ligament of left ankle, initial encounter: Secondary | ICD-10-CM | POA: Diagnosis not present

## 2015-05-29 DIAGNOSIS — S99912A Unspecified injury of left ankle, initial encounter: Secondary | ICD-10-CM | POA: Diagnosis present

## 2015-05-29 DIAGNOSIS — Z8543 Personal history of malignant neoplasm of ovary: Secondary | ICD-10-CM | POA: Diagnosis not present

## 2015-05-29 DIAGNOSIS — F419 Anxiety disorder, unspecified: Secondary | ICD-10-CM | POA: Diagnosis not present

## 2015-05-29 DIAGNOSIS — F17210 Nicotine dependence, cigarettes, uncomplicated: Secondary | ICD-10-CM | POA: Diagnosis not present

## 2015-05-29 DIAGNOSIS — Z8679 Personal history of other diseases of the circulatory system: Secondary | ICD-10-CM | POA: Insufficient documentation

## 2015-05-29 DIAGNOSIS — Y998 Other external cause status: Secondary | ICD-10-CM | POA: Insufficient documentation

## 2015-05-29 DIAGNOSIS — Y9289 Other specified places as the place of occurrence of the external cause: Secondary | ICD-10-CM | POA: Diagnosis not present

## 2015-05-29 DIAGNOSIS — Y9389 Activity, other specified: Secondary | ICD-10-CM | POA: Insufficient documentation

## 2015-05-29 DIAGNOSIS — W182XXA Fall in (into) shower or empty bathtub, initial encounter: Secondary | ICD-10-CM | POA: Diagnosis not present

## 2015-05-29 NOTE — ED Notes (Signed)
Pt c/o pain to L foot after falling in the shower yesterday. Pt with her own boot in place for comfort. Ambulatory with limp.

## 2015-05-29 NOTE — Discharge Instructions (Signed)
Sarah [name],  Nice meeting you! Please follow-up with orthopedics on January 3rd. Return to the emergency department if you are unable to walk, have increased swelling or have increased pain. Feel better soon!  S. Wendie Simmer, PA-C   Ankle Sprain An ankle sprain is an injury to the strong, fibrous tissues (ligaments) that hold the bones of your ankle joint together.  CAUSES An ankle sprain is usually caused by a fall or by twisting your ankle. Ankle sprains most commonly occur when you step on the outer edge of your foot, and your ankle turns inward. People who participate in sports are more prone to these types of injuries.  SYMPTOMS   Pain in your ankle. The pain may be present at rest or only when you are trying to stand or walk.  Swelling.  Bruising. Bruising may develop immediately or within 1 to 2 days after your injury.  Difficulty standing or walking, particularly when turning corners or changing directions. DIAGNOSIS  Your caregiver will ask you details about your injury and perform a physical exam of your ankle to determine if you have an ankle sprain. During the physical exam, your caregiver will press on and apply pressure to specific areas of your foot and ankle. Your caregiver will try to move your ankle in certain ways. An X-ray exam may be done to be sure a bone was not broken or a ligament did not separate from one of the bones in your ankle (avulsion fracture).  TREATMENT  Certain types of braces can help stabilize your ankle. Your caregiver can make a recommendation for this. Your caregiver may recommend the use of medicine for pain. If your sprain is severe, your caregiver may refer you to a surgeon who helps to restore function to parts of your skeletal system (orthopedist) or a physical therapist. [place] ice to your injury for 1-2 days or as directed by your caregiver. Applying ice helps to reduce inflammation and pain.  Put ice in a  plastic bag.  Place a towel between your skin and the bag.  Leave the ice on for 15-20 minutes at a time, every 2 hours while you are awake.  Only take over-the-counter or prescription medicines for pain, discomfort, or fever as directed by your caregiver.  Elevate your injured ankle above the level of your heart as much as possible for 2-3 days.  If your caregiver recommends crutches, use them as instructed. Gradually put weight on the affected ankle. Continue to use crutches or a cane until you can walk without feeling pain in your ankle.  If you have a plaster splint, wear the splint as directed by your caregiver. Do not rest it on anything harder than a pillow for the first 24 hours. Do not put weight on it. Do not get it wet. You may take it off to take a shower or bath.  You may have been given an elastic bandage to wear around your ankle to provide support. If the elastic bandage is too tight (you have numbness or tingling in your foot or your foot becomes cold and blue), adjust the bandage to make it comfortable.  If you have an air splint, you may blow more air into it or let air out to make it more comfortable. You may take your splint off at night and before taking a shower or bath. Wiggle your toes in the splint several times per day to decrease swelling. [place]  IF:   You have rapidly increasing bruising or swelling.  Your toes feel extremely cold or you lose feeling in your foot.  Your pain is not relieved with medicine. SEEK IMMEDIATE MEDICAL CARE IF:  Your toes are numb or blue.  You have severe pain that is increasing. MAKE SURE YOU:   Understand these instructions.  Will watch your condition.  Will get help right away if you are not doing well or get worse.   This information is not intended to replace advice given to you by your health care provider. Make sure you discuss any questions you have with your health care provider.   Document Released:  05/22/2005 Document Revised: 06/12/2014 Document Reviewed: 06/03/2011 Elsevier Interactive Patient Education [employer].

## 2015-05-29 NOTE — ED Provider Notes (Signed)
CSN: [id number]     Arrival date & time 05/29/15  1839 History   First MD Initiated Contact with Patient 05/29/15 2226     Chief Complaint  Patient presents with  . Foot Pain   HPI   Sarah [name] is a 54 y.o. F PMH significant for ovarian cancer, anxiety, depression, SVT presenting with a 2 day history of left foot pain.  She states she was getting into the shower when she "pulled something" in her left foot. She describes her pain as 10/10 pain scale, worse with movement, constant, achy, outer side of her left foot, non-radiating. She denies fevers, chills, SOB, N/V.   Past Medical History  Diagnosis Date  . Ovarian cancer ([place])   . Anxiety   . Rapid heart rate   . Migraine   . Depression   . Stress incontinence   . SVT (supraventricular tachycardia) [facility])    Past Surgical History  Procedure Laterality Date  . Ovary surgery    . Cholecystectomy    . Abdominal hysterectomy    . Mouth surgery      stone  . Bladder tack    . Foot surgery     Family History  Problem Relation Age of Onset  . Hypertension Mother   . Hypertension Father   . Heart attack Paternal Grandfather   . Heart attack Maternal Uncle   . Stroke Paternal Grandmother    Social History  Substance Use Topics  . Smoking status: Current Every Day Smoker -- 0.50 packs/day for 5 years    Types: Cigarettes  . Smokeless tobacco: None     Comment: quit previously, restarted in 2007  . Alcohol Use: No   OB History    No data available     Review of Systems  Ten systems are reviewed and are negative for acute change except as noted in the HPI  Allergies  Avelox; Iodine; Quinolones; and Red dye  Home Medications   Prior to Admission medications   Medication Sig Start Date End Date Taking? Authorizing Provider  albuterol (PROVENTIL HFA;VENTOLIN HFA) 108 (90 BASE) MCG/ACT inhaler Inhale 2 puffs into the lungs every 4 (four) hours as needed for wheezing or shortness of breath.    Historical Provider, MD   aspirin 81 MG tablet Take 81 mg by mouth daily.      Historical Provider, MD  atenolol (TENORMIN) 25 MG tablet Take up to 25 mg in am and 25 mg in pm as needed. 02/01/15   Pixie Casino, MD  Biotin 5 MG CAPS Take 1 capsule by mouth daily.    Historical Provider, MD  cephALEXin (KEFLEX) 500 MG capsule Take 1 capsule (500 mg total) by mouth 4 (four) times daily. 02/06/15   Sherwood Gambler, MD  clonazePAM (KLONOPIN) 2 MG tablet Take 1 mg by mouth 4 (four) times daily.     Historical Provider, MD  Cyanocobalamin (B-12 PO) Take 1 tablet by mouth daily. SUPER B-COMPLEX    Historical Provider, MD  fludrocortisone (FLORINEF) 0.1 MG tablet Take 1 tablet (0.1 mg total) by mouth daily. 04/02/15   Pixie Casino, MD  meclizine (ANTIVERT) 25 MG tablet Take 25 mg by mouth 3 (three) times daily as needed for dizziness.    Historical Provider, MD  midodrine (PROAMATINE) 10 MG tablet Take 1-2 tablets by mouth 3 times a day. 02/09/15   Pixie Casino, MD  mirabegron ER (MYRBETRIQ) 50 MG TB24 tablet Take 50 mg by mouth daily. 09/15/13  Historical Provider, MD  Multiple Vitamin (MULTIVITAMIN) tablet Take 1 tablet by mouth daily. [place]    Historical Provider, MD  Omega-3 Fatty Acids (FISH OIL) 1200 MG CAPS Take 2 capsules by mouth 2 (two) times daily.     Historical Provider, MD  pantoprazole (PROTONIX) 40 MG tablet Take 40 mg by mouth 2 (two) times daily.    Historical Provider, MD  pravastatin (PRAVACHOL) 20 MG tablet Take by mouth daily.      Historical Provider, MD  sertraline (ZOLOFT) 25 MG tablet Take 25 mg by mouth daily. Py taking 1 tablet by mouth every morning for 2 weeks then take 2 tablets every morning    Historical Provider, MD  sucralfate (CARAFATE) 1 G tablet Take 1 g by mouth 4 (four) times daily.    Historical Provider, MD  traZODone (DESYREL) 50 MG tablet Take 50 mg by mouth as needed for sleep.    Historical Provider, MD   BP 112/69 mmHg  Pulse 81  Temp(Src) 98 F (36.7 C) (Oral)   Resp 20  Ht 5\' 2"  (1.575 m)  Wt 61.236 kg  BMI 24.69 kg/m2  SpO2 94% Physical Exam  Constitutional: She appears well-developed and well-nourished. No distress.  HENT:  Head: Normocephalic and atraumatic.  Mouth/Throat: Oropharynx is clear and moist. No oropharyngeal exudate.  Eyes: Conjunctivae are normal. Pupils are equal, round, and reactive to light. Right eye exhibits no discharge. Left eye exhibits no discharge. No scleral icterus.  Neck: No tracheal deviation present.  Cardiovascular: Normal rate, regular rhythm, normal heart sounds and intact distal pulses.  Exam reveals no gallop and no friction rub.   No murmur heard. Pulmonary/Chest: Effort normal and breath sounds normal. No respiratory distress. She has no wheezes. She has no rales. She exhibits no tenderness.  Abdominal: Soft. Bowel sounds are normal. She exhibits no distension and no mass. There is no tenderness. There is no rebound and no guarding.  Musculoskeletal: Normal range of motion. She exhibits tenderness. She exhibits no edema.  Tenderness along lateral aspect of left foot. Neurovascularly intact BL.   Lymphadenopathy:    She has no cervical adenopathy.  Neurological: She is alert. Coordination normal.  Skin: Skin is warm and dry. No rash noted. She is not diaphoretic. No erythema.  Psychiatric: She has a normal mood and affect. Her behavior is normal.  Nursing note and vitals reviewed.   ED Course  Procedures  Imaging Review Dg Foot Complete Left  05/29/2015  CLINICAL DATA:  54 year old female with fall and left foot injury. Pain on the lateral aspect of the foot. EXAM: LEFT FOOT - COMPLETE 3+ VIEW COMPARISON:  None. FINDINGS: There is no evidence of fracture or dislocation. There is no evidence of arthropathy or other focal bone abnormality. Soft tissues are unremarkable. IMPRESSION: Negative. Electronically Signed   By: Anner Crete M.D.   On: 05/29/2015 19:19   I have personally reviewed and evaluated  these images and lab results as part of my medical decision-making.  MDM   Final diagnoses:  Ankle sprain, left, initial encounter   Patient non-toxic appearing and VSS. Most likely fracture vs sprain. Based on patient history and physical exam, DVT and septic joint less likely.  Left foot xray unremarkable. Will ACE wrap (I think this will be more comfortable than the boot she presented with given her pain is on the lateral aspect of her foot). The patient already has a follow up scheduled with Dr. Percell Miller on 06/08/15. Patient may  be safely discharged home. Discussed reasons for return. Patient to follow-up with primary care provider and ortho as needed. RICE and NSAIDs for symptomatic treatment. Patient in understanding and agreement with the plan.   [place]ibel Lions, PA-C 06/10/15 [place], MD 06/11/15 [phone]

## 2015-06-04 ENCOUNTER — Telehealth: Payer: Self-pay | Admitting: Internal Medicine

## 2015-06-04 NOTE — Telephone Encounter (Signed)
Mrs. Sarah [name] is calling because her bp was up , but has gone down now , but her heart rate in is in the 50"s . Marland KitchenPlease call  Thanks

## 2015-06-05 ENCOUNTER — Encounter (HOSPITAL_BASED_OUTPATIENT_CLINIC_OR_DEPARTMENT_OTHER): Payer: Self-pay | Admitting: Emergency Medicine

## 2015-06-05 ENCOUNTER — Telehealth: Payer: Self-pay | Admitting: Internal Medicine

## 2015-06-05 ENCOUNTER — Emergency Department (HOSPITAL_BASED_OUTPATIENT_CLINIC_OR_DEPARTMENT_OTHER)
Admission: EM | Admit: 2015-06-05 | Discharge: 2015-06-05 | Disposition: A | Discharge status: Home / Self Care | Payer: BLUE CROSS/BLUE SHIELD | Attending: Emergency Medicine | Admitting: Emergency Medicine

## 2015-06-05 DIAGNOSIS — G43909 Migraine, unspecified, not intractable, without status migrainosus: Secondary | ICD-10-CM | POA: Insufficient documentation

## 2015-06-05 DIAGNOSIS — F329 Major depressive disorder, single episode, unspecified: Secondary | ICD-10-CM | POA: Diagnosis not present

## 2015-06-05 DIAGNOSIS — R001 Bradycardia, unspecified: Secondary | ICD-10-CM | POA: Diagnosis present

## 2015-06-05 DIAGNOSIS — Z8543 Personal history of malignant neoplasm of ovary: Secondary | ICD-10-CM | POA: Diagnosis not present

## 2015-06-05 DIAGNOSIS — F17210 Nicotine dependence, cigarettes, uncomplicated: Secondary | ICD-10-CM | POA: Diagnosis not present

## 2015-06-05 DIAGNOSIS — F419 Anxiety disorder, unspecified: Secondary | ICD-10-CM | POA: Diagnosis not present

## 2015-06-05 DIAGNOSIS — Z7982 Long term (current) use of aspirin: Secondary | ICD-10-CM | POA: Diagnosis not present

## 2015-06-05 DIAGNOSIS — Z87448 Personal history of other diseases of urinary system: Secondary | ICD-10-CM | POA: Insufficient documentation

## 2015-06-05 DIAGNOSIS — Z792 Long term (current) use of antibiotics: Secondary | ICD-10-CM | POA: Diagnosis not present

## 2015-06-05 DIAGNOSIS — Z79899 Other long term (current) drug therapy: Secondary | ICD-10-CM | POA: Insufficient documentation

## 2015-06-05 LAB — BASIC METABOLIC PANEL
ANION GAP: 7 (ref 5–15)
BUN: 12 mg/dL (ref 6–20)
CHLORIDE: 106 mmol/L (ref 101–111)
CO2: 27 mmol/L (ref 22–32)
Calcium: 9.2 mg/dL (ref 8.9–10.3)
Creatinine, Ser: 0.96 mg/dL (ref 0.44–1.00)
GFR calc Af Amer: >60 mL/min (ref >60)
GFR calc non Af Amer: >60 mL/min (ref >60)
GLUCOSE: 96 mg/dL (ref 65–99)
POTASSIUM: 3.7 mmol/L (ref 3.5–5.1)
Sodium: 140 mmol/L (ref 135–145)

## 2015-06-05 LAB — CBC WITH DIFFERENTIAL/PLATELET
Basophils Absolute: 0 10*3/uL (ref 0.0–0.1)
Basophils Relative: 0 %
Eosinophils Absolute: 0.1 10*3/uL (ref 0.0–0.7)
Eosinophils Relative: 2 %
HEMATOCRIT: 36.4 % (ref 36.0–46.0)
Hemoglobin: 12.2 g/dL (ref 12.0–15.0)
LYMPHS PCT: 19 %
Lymphs Abs: 1.4 10*3/uL (ref 0.7–4.0)
MCH: 26.9 pg (ref 26.0–34.0)
MCHC: 33.5 g/dL (ref 30.0–36.0)
MCV: 80.4 fL (ref 78.0–100.0)
MONO ABS: 0.7 10*3/uL (ref 0.1–1.0)
MONOS PCT: 10 %
NEUTROS ABS: 5 10*3/uL (ref 1.7–7.7)
Neutrophils Relative %: 69 %
Platelets: 165 10*3/uL (ref 150–400)
RBC: 4.53 MIL/uL (ref 3.87–5.11)
RDW: 14.1 % (ref 11.5–15.5)
WBC: 7.3 10*3/uL (ref 4.0–10.5)

## 2015-06-05 LAB — TROPONIN I: Troponin I: <0.03 ng/mL (ref <0.031)

## 2015-06-05 NOTE — Telephone Encounter (Signed)
Patient called concerned about her HR.  She states ranging 50-60s.  She reports not usually this low.  She has hx of orthostatic hypotension and is on midodrine.  She is also on Atenolol.  She will hold Atenolol and monitor BP and HR.

## 2015-06-05 NOTE — ED Notes (Signed)
Pt states ' I take medication to keep my blood pressure up, and I take another medication because my heart races. Today I checked my heart rate and it was low." She states she called the doctors office who instructed her to call back if HR <55. Pt states she checked it at home and it read 49. Pt reports feeling lightheaded, but denies other symptoms. Denies any changes to these or other medications recently, states she has been on these for approximately 3 years.

## 2015-06-05 NOTE — Discharge Instructions (Signed)
Bradycardia  Bradycardia is a slower-than-normal heart rate. A normal resting heart rate for an adult ranges from 60 to 100 beats per minute. With bradycardia, the resting heart rate is less than 60 beats per minute.  Bradycardia is a problem if your heart cannot pump enough oxygen-rich blood through your body. Bradycardia is not a problem for everyone. For some healthy adults, a slow resting heart rate is normal.   CAUSES   Bradycardia may be caused by:  · A problem with the heart's electrical system, such as heart block.  · A problem with the heart's natural pacemaker (sinus node).  · Heart disease, damage, or infection.  · Certain medicines that treat heart conditions.  · Certain conditions, such as hypothyroidism and obstructive sleep apnea.  RISK FACTORS   Risk factors include:  · Being 65 or older.  · Having high blood pressure (hypertension), high cholesterol (hyperlipidemia), or diabetes.  · Drinking heavily, using tobacco products, or using drugs.  · Being stressed.  SIGNS AND SYMPTOMS   Signs and symptoms include:  · Light-headedness.  · Fainting or near fainting.  · Fatigue and weakness.  · Shortness of breath.  · Chest pain (angina).  · Drowsiness.  · Confusion.  · Dizziness.  DIAGNOSIS   Diagnosis of bradycardia may include:  · A physical exam.  · An electrocardiogram (ECG).  · Blood tests.  TREATMENT   Treatment for bradycardia may include:  · Treatment of an underlying condition.  · Pacemaker placement. A pacemaker is a small, battery-powered device that is placed under the skin and is programmed to sense your heartbeats. If your heart rate is lower than the programmed rate, the pacemaker will pace your heart.  · Changing your medicines or dosages.  HOME CARE INSTRUCTIONS  · Take medicines only as directed by your health care provider.  · Manage any health conditions that contribute to bradycardia as directed by your health care provider.  · Follow a heart-healthy diet. A dietitian can help educate  you on healthy food options and changes.  · Follow an exercise program approved by your health care provider.  · Maintain a healthy weight. Lose weight as approved by your health care provider.  · Do not use tobacco products, including cigarettes, chewing tobacco, or electronic cigarettes. If you need help quitting, ask your health care provider.  · Do not use illegal drugs.  · Limit alcohol intake to no more than 1 drink per day for nonpregnant women and 2 drinks per day for men. One drink equals 12 ounces of beer, 5 ounces of wine, or 1½ ounces of hard liquor.  · Keep all follow-up visits as directed by your health care provider. This is important.  SEEK MEDICAL CARE IF:  · You feel light-headed or dizzy.  · You almost faint.  · You feel weak or are easily fatigued during physical activity.  · You experience confusion or have memory problems.  SEEK IMMEDIATE MEDICAL CARE IF:   · You faint.  · You have an irregular heartbeat.  · You have chest pain.  · You have trouble breathing.  MAKE SURE YOU:   · Understand these instructions.  · Will watch your condition.  · Will get help right away if you are not doing well or get worse.     This information is not intended to replace advice given to you by your health care provider. Make sure you discuss any questions you have with your health care provider.       Document Released: 02/11/2002 Document Revised: 06/12/2014 Document Reviewed: 08/27/2013  Elsevier Interactive Patient Education ©2016 Elsevier Inc[employer]

## 2015-06-05 NOTE — ED Provider Notes (Signed)
CSN: [id number]     Arrival date & time 06/05/15  0144 History   First MD Initiated Contact with Patient 06/05/15 0234     Chief Complaint  Patient presents with  . Bradycardia     (Consider location/radiation/quality/duration/timing/severity/associated sxs/prior Treatment) HPI  This is a 54 year old female with a history of rapid heartbeat for which she takes atenolol up to 25 milligrams twice daily as needed. She takes her pulse frequently. She is here with a two-day history of bradycardia. She did not take any atenolol yesterday because of this. She consulted her physician and he advised she be seen in the ED if her heart rate 155. It is been as low as 49 on the monitor since arrival. She is having some mild lightheadedness and weakness but no syncope or near-syncope. She denies chest pain, shortness of breath, nausea, vomiting and diarrhea. She has been taking her Florinef regularly as well as midodrine as needed for hypotension; she took 2 doses of midodrine yesterday.   Past Medical History  Diagnosis Date  . Ovarian cancer ([place])   . Anxiety   . Rapid heart rate   . Migraine   . Depression   . Stress incontinence   . SVT (supraventricular tachycardia) [facility])    Past Surgical History  Procedure Laterality Date  . Ovary surgery    . Cholecystectomy    . Abdominal hysterectomy    . Mouth surgery      stone  . Bladder tack    . Foot surgery     Family History  Problem Relation Age of Onset  . Hypertension Mother   . Hypertension Father   . Heart attack Paternal Grandfather   . Heart attack Maternal Uncle   . Stroke Paternal Grandmother    Social History  Substance Use Topics  . Smoking status: Current Every Day Smoker -- 0.50 packs/day for 5 years    Types: Cigarettes  . Smokeless tobacco: None     Comment: quit previously, restarted in 2007  . Alcohol Use: No   OB History    No data available     Review of Systems  All other systems reviewed and are  negative.   Allergies  Avelox; Iodine; Quinolones; and Red dye  Home Medications   Prior to Admission medications   Medication Sig Start Date End Date Taking? Authorizing Provider  albuterol (PROVENTIL HFA;VENTOLIN HFA) 108 (90 BASE) MCG/ACT inhaler Inhale 2 puffs into the lungs every 4 (four) hours as needed for wheezing or shortness of breath.    Historical Provider, MD  aspirin 81 MG tablet Take 81 mg by mouth daily.      Historical Provider, MD  atenolol (TENORMIN) 25 MG tablet Take up to 25 mg in am and 25 mg in pm as needed. 02/01/15   Pixie Casino, MD  Biotin 5 MG CAPS Take 1 capsule by mouth daily.    Historical Provider, MD  cephALEXin (KEFLEX) 500 MG capsule Take 1 capsule (500 mg total) by mouth 4 (four) times daily. 02/06/15   Sherwood Gambler, MD  clonazePAM (KLONOPIN) 2 MG tablet Take 1 mg by mouth 4 (four) times daily.     Historical Provider, MD  Cyanocobalamin (B-12 PO) Take 1 tablet by mouth daily. SUPER B-COMPLEX    Historical Provider, MD  fludrocortisone (FLORINEF) 0.1 MG tablet Take 1 tablet (0.1 mg total) by mouth daily. 04/02/15   Pixie Casino, MD  meclizine (ANTIVERT) 25 MG tablet Take 25 mg by mouth  3 (three) times daily as needed for dizziness.    Historical Provider, MD  midodrine (PROAMATINE) 10 MG tablet Take 1-2 tablets by mouth 3 times a day. 02/09/15   Pixie Casino, MD  mirabegron ER (MYRBETRIQ) 50 MG TB24 tablet Take 50 mg by mouth daily. 09/15/13   Historical Provider, MD  Multiple Vitamin (MULTIVITAMIN) tablet Take 1 tablet by mouth daily. [place]    Historical Provider, MD  Omega-3 Fatty Acids (FISH OIL) 1200 MG CAPS Take 2 capsules by mouth 2 (two) times daily.     Historical Provider, MD  pantoprazole (PROTONIX) 40 MG tablet Take 40 mg by mouth 2 (two) times daily.    Historical Provider, MD  pravastatin (PRAVACHOL) 20 MG tablet Take by mouth daily.      Historical Provider, MD  sertraline (ZOLOFT) 25 MG tablet Take 25 mg by mouth daily.  Py taking 1 tablet by mouth every morning for 2 weeks then take 2 tablets every morning    Historical Provider, MD  sucralfate (CARAFATE) 1 G tablet Take 1 g by mouth 4 (four) times daily.    Historical Provider, MD  traZODone (DESYREL) 50 MG tablet Take 50 mg by mouth as needed for sleep.    Historical Provider, MD   BP 137/86 mmHg  Pulse 65  Temp(Src) 97.5 F (36.4 C) (Oral)  Resp 18  Ht 5\' 2"  (1.575 m)  Wt 130 lb (58.968 kg)  BMI 23.77 kg/m2  SpO2 97%   Physical Exam General: Well-developed, well-nourished female in no acute distress; appearance consistent with age of record HENT: normocephalic; atraumatic Eyes: pupils equal, round and reactive to light; extraocular muscles intact Neck: supple Heart: regular rate and rhythm; bradycardia Lungs: clear to auscultation bilaterally Abdomen: soft; nondistended; nontender; no masses or hepatosplenomegaly; bowel sounds present Extremities: No deformity; full range of motion except left ankle and foot immobilized in orthopedic boot; pulses normal on the accessible foot Neurologic: Awake, alert and oriented; motor function intact in all extremities and symmetric; no facial droop Skin: Warm and dry Psychiatric: Normal mood and affect    ED Course  Procedures (including critical care time)   EKG Interpretation   Date/Time:  Saturday June 05 2015 02:22:27 EST Ventricular Rate:  50 PR Interval:  150 QRS Duration: 89 QT Interval:  471 QTC Calculation: 429 R Axis:   53 Text Interpretation:  Sinus  Sinus bradycardia Borderline T abnormalities,  anterior leads Anterior T-wave inversions not seen previously Confirmed by  Rayla Pember  MD, Jenny Reichmann ([id number]) on 06/05/2015 2:37:58 AM      MDM  Nursing notes and vitals signs, including pulse oximetry, reviewed.  Summary of this visit's results, reviewed by myself:  Labs:  Results for orders placed or performed during the hospital encounter of 06/05/15 (from the past 24 hour(s))  Basic  metabolic panel     Status: None   Collection Time: 06/05/15  2:43 AM  Result Value Ref Range   Sodium 140 135 - 145 mmol/L   Potassium 3.7 3.5 - 5.1 mmol/L   Chloride 106 101 - 111 mmol/L   CO2 27 22 - 32 mmol/L   Glucose, Bld 96 65 - 99 mg/dL   BUN 12 6 - 20 mg/dL   Creatinine, Ser 0.96 0.44 - 1.00 mg/dL   Calcium 9.2 8.9 - 10.3 mg/dL   GFR calc non Af Amer >60 >60 mL/min   GFR calc Af Amer >60 >60 mL/min   Anion gap 7 5 - 15  CBC with Differential/Platelet     Status: None   Collection Time: 06/05/15  2:43 AM  Result Value Ref Range   WBC 7.3 4.0 - 10.5 K/uL   RBC 4.53 3.87 - 5.11 MIL/uL   Hemoglobin 12.2 12.0 - 15.0 g/dL   HCT 36.4 36.0 - 46.0 %   MCV 80.4 78.0 - 100.0 fL   MCH 26.9 26.0 - 34.0 pg   MCHC 33.5 30.0 - 36.0 g/dL   RDW 14.1 11.5 - 15.5 %   Platelets 165 150 - 400 K/uL   Neutrophils Relative % 69 %   Neutro Abs 5.0 1.7 - 7.7 K/uL   Lymphocytes Relative 19 %   Lymphs Abs 1.4 0.7 - 4.0 K/uL   Monocytes Relative 10 %   Monocytes Absolute 0.7 0.1 - 1.0 K/uL   Eosinophils Relative 2 %   Eosinophils Absolute 0.1 0.0 - 0.7 K/uL   Basophils Relative 0 %   Basophils Absolute 0.0 0.0 - 0.1 K/uL  Troponin I     Status: None   Collection Time: 06/05/15  2:44 AM  Result Value Ref Range   Troponin I <0.03 <0.031 ng/mL   3:29 AM Patient's heart rate has been in the 50s but will go up into the 60s with activity. Her electrolytes are unremarkable and there is no specific treatment at this time. Her associated lightheadedness is mild. She was advised that atenolol and midodrine can both cause sinus bradycardia. She was advised to contact her cardiologist regarding her dosing.     Shanon Rosser, MD 06/05/15 [phone]

## 2015-06-08 NOTE — Telephone Encounter (Signed)
Pt was seen in the ED 06-05-15.

## 2015-06-09 ENCOUNTER — Encounter: Payer: Self-pay | Admitting: Cardiology

## 2015-06-09 ENCOUNTER — Ambulatory Visit (INDEPENDENT_AMBULATORY_CARE_PROVIDER_SITE_OTHER): Payer: BLUE CROSS/BLUE SHIELD | Admitting: Cardiology

## 2015-06-09 VITALS — BP 100/60 | HR 84 | Ht 62.0 in | Wt 135.0 lb

## 2015-06-09 DIAGNOSIS — I951 Orthostatic hypotension: Secondary | ICD-10-CM | POA: Diagnosis not present

## 2015-06-09 DIAGNOSIS — R000 Tachycardia, unspecified: Secondary | ICD-10-CM | POA: Diagnosis not present

## 2015-06-09 DIAGNOSIS — R001 Bradycardia, unspecified: Secondary | ICD-10-CM

## 2015-06-09 DIAGNOSIS — F17200 Nicotine dependence, unspecified, uncomplicated: Secondary | ICD-10-CM

## 2015-06-09 DIAGNOSIS — F419 Anxiety disorder, unspecified: Secondary | ICD-10-CM

## 2015-06-09 DIAGNOSIS — Z720 Tobacco use: Secondary | ICD-10-CM

## 2015-06-09 NOTE — Assessment & Plan Note (Signed)
Klonopin QID

## 2015-06-09 NOTE — Assessment & Plan Note (Signed)
1/2 ppd 

## 2015-06-09 NOTE — Progress Notes (Signed)
06/09/2015 [name]   Sep 12, 1960  [id number]  Primary Physician Sarah Shelter, PA-C Primary Cardiologist: Dr Sarah [name]  HPI:  55 year old female followed by Dr Sarah [name] with a history of inappropriate tachycardia for which she takes atenolol up to 25 mg twice daily as needed for HR > 105. She takes her pulse frequently. She presented to the ED at [facility] 06/05/15 with a two-day history of bradycardia. She consulted the on call physician and he advised she be seen in the ED. In the ED her HR was as low as 49 on the monitor. She was having some mild lightheadedness and weakness but no syncope or near-syncope. She denied chest pain, shortness of breath, nausea, vomiting and diarrhea. She had been taking her Florinef regularly as well as midodrine as needed for hypotension. She is in the office today for follow up. HR is 84. She has not had any syncope or pre syncope.    Current Outpatient Prescriptions  Medication Sig Dispense Refill  . albuterol (PROVENTIL HFA;VENTOLIN HFA) 108 (90 BASE) MCG/ACT inhaler Inhale 2 puffs into the lungs every 4 (four) hours as needed for wheezing or shortness of breath.    Marland Kitchen aspirin 81 MG tablet Take 81 mg by mouth daily.      Marland Kitchen atenolol (TENORMIN) 25 MG tablet Take up to 25 mg in am and 25 mg in pm as needed. 60 tablet 5  . Biotin 5 MG CAPS Take 1 capsule by mouth daily.    . clonazePAM (KLONOPIN) 2 MG tablet Take 1 mg by mouth 4 (four) times daily.     . Cyanocobalamin (B-12 PO) Take 1 tablet by mouth daily. SUPER B-COMPLEX    . fludrocortisone (FLORINEF) 0.1 MG tablet Take 1 tablet (0.1 mg total) by mouth daily. 0.1 tablet 6  . meclizine (ANTIVERT) 25 MG tablet Take 25 mg by mouth 3 (three) times daily as needed for dizziness.    . midodrine (PROAMATINE) 10 MG tablet Take 1-2 tablets by mouth 3 times a day. 180 tablet 3  . mirabegron ER (MYRBETRIQ) 50 MG TB24 tablet Take 50 mg by mouth daily.    . Multiple Vitamin (MULTIVITAMIN) tablet Take 1 tablet by mouth daily.  ALIVE WOMEN'S ENERGY    . Omega-3 Fatty Acids (FISH OIL) 1200 MG CAPS Take 2 capsules by mouth 2 (two) times daily.     . pantoprazole (PROTONIX) 40 MG tablet Take 40 mg by mouth 2 (two) times daily.    . pravastatin (PRAVACHOL) 20 MG tablet Take by mouth daily.      . sertraline (ZOLOFT) 25 MG tablet Take 25 mg by mouth daily. Py taking 1 tablet by mouth every morning for 2 weeks then take 2 tablets every morning    . traZODone (DESYREL) 50 MG tablet Take 50 mg by mouth as needed for sleep.     No current facility-administered medications for this visit.    Allergies  Allergen Reactions  . Avelox [Moxifloxacin Hcl In Nacl] Palpitations  . Iodine Swelling and Other (See Comments)    Sensation of internal heat  . Quinolones Palpitations  . Red Dye Swelling    Social History   Social History  . Marital Status: Single    Spouse Name: N/A  . Number of Children: N/A  . Years of Education: N/A   Occupational History  . Not on file.   Social History Main Topics  . Smoking status: Current Every Day Smoker -- 0.50 packs/day for 5 years  Types: Cigarettes  . Smokeless tobacco: Not on file     Comment: quit previously, restarted in 2007  . Alcohol Use: No  . Drug Use: No  . Sexual Activity: Yes    Birth Control/ Protection: Surgical   Other Topics Concern  . Not on file   Social History Narrative     Review of Systems: General: negative for chills, fever, night sweats or weight changes.  Cardiovascular: negative for chest pain, dyspnea on exertion, edema, orthopnea, palpitations, paroxysmal nocturnal dyspnea or shortness of breath Dermatological: negative for rash Respiratory: negative for cough or wheezing Urologic: negative for hematuria Abdominal: negative for nausea, vomiting, diarrhea, bright red blood per rectum, melena, or hematemesis Neurologic: negative for visual changes, syncope, or dizziness All other systems reviewed and are otherwise negative except as noted  above.    Blood pressure 100/60, pulse 84, height 5\' 2"  (1.575 m), weight 135 lb (61.236 kg).  General appearance: alert, cooperative, no distress and anxious Neck: no JVD Lungs: clear to auscultation bilaterally and few rhonchi Heart: regular rate and rhythm Extremities: no edema Skin: Skin color, texture, turgor normal. No rashes or lesions Neurologic: Grossly normal   ASSESSMENT AND PLAN:   Bradycardia Pt seen in the ED 06/05/15 because her HR was "in the 40s" and she felt weak. (EKG showed HR of 50)  Inappropriate sinus tachycardia (HCC) Takes Atenolol prn for HR > 105  Orthostatic hypotension On Florinef daily, Midodrine prn  Anxiety Klonopin QID  Smoker 1/2 ppd   PLAN  I offered an event monitor. At this time she would like to wait. She sees Dr Sarah [name] in a couple of weeks. If she has recurrent bradycardia we should do a 21 day event.   Sarah [name] K PA-C 06/09/2015 3:06 PM

## 2015-06-09 NOTE — Patient Instructions (Signed)
Continue same medications   Keep appointment with Dr.Hilty Tue 07/13/15 at 2:45 pm

## 2015-06-09 NOTE — Assessment & Plan Note (Signed)
On Florinef daily, Midodrine prn

## 2015-06-09 NOTE — Assessment & Plan Note (Addendum)
Takes Atenolol prn for HR > 105

## 2015-06-09 NOTE — Assessment & Plan Note (Signed)
Pt seen in the ED 06/05/15 because her HR was "in the 40s" and she felt weak. (EKG showed HR of 50)

## 2015-06-29 ENCOUNTER — Encounter: Payer: Self-pay | Admitting: Neurology

## 2015-06-29 ENCOUNTER — Ambulatory Visit (INDEPENDENT_AMBULATORY_CARE_PROVIDER_SITE_OTHER): Payer: BLUE CROSS/BLUE SHIELD | Admitting: Neurology

## 2015-06-29 VITALS — BP 111/71 | HR 83 | Ht 62.0 in | Wt 134.0 lb

## 2015-06-29 DIAGNOSIS — R202 Paresthesia of skin: Secondary | ICD-10-CM | POA: Diagnosis not present

## 2015-06-29 DIAGNOSIS — W19XXXA Unspecified fall, initial encounter: Secondary | ICD-10-CM

## 2015-06-29 DIAGNOSIS — R51 Headache: Secondary | ICD-10-CM | POA: Diagnosis not present

## 2015-06-29 DIAGNOSIS — R519 Headache, unspecified: Secondary | ICD-10-CM

## 2015-06-29 DIAGNOSIS — R200 Anesthesia of skin: Secondary | ICD-10-CM

## 2015-06-29 DIAGNOSIS — H539 Unspecified visual disturbance: Secondary | ICD-10-CM | POA: Diagnosis not present

## 2015-06-29 NOTE — Progress Notes (Signed)
[facility]    Provider:  Dr Jaynee Eagles Referring Provider: Corine Shelter, PA-C Primary Care Physician:  Beatris Si  CC:  paresthesias  HPI:  Sarah [name] is a 55 y.o. female here as a referral from Dr. Aron Baba for paresthesias. She is here with her mother who provides information.  PMHx of Fibromyalgia(recent Dx by rheumatology), overuse of medicine, smoker, headaches, chronic pain, anxiety, migraine, depression, sinus tachycardia, chronic fatigue. She a multitude of complaints today but she was referred for  numbness and tingling in the hands or the feet, happens every day, is episodic, moves around. Her right thumb ha been numb for over a week. Left foot was numb for a while and now is better. The right side of her face goes numb occasionally. No triggers. No inciting events. It just comes and goes in different places. She has weakness in the hands. Started 2 months ago. Sometimes it can be hands, at the same or different times, sometimes the feet. Getting worse. She is having vision changes. She went to the eye doctor and gave her a pair of glasses that did not work, reading glasses have not helped wither, still blurred. She gets episodic blurriness. She takes b12 because it was low, she has been taking that for a year or more. No inciting events. The symptoms are variable, can last all day or half a day. She has some paresthesias  it every day. She has chronic all over body pain. She has severe headaches. Worsening headaches. She has occasional tremors.   Reviewed notes, labs and imaging from outside physicians, which showed: CT of the head in 2010:   CT showed No acute intracranial abnormalities including mass lesion or mass effect, hydrocephalus, extra-axial fluid collection, midline shift, hemorrhage, or acute infarction, large ischemic events (personally reviewed images)  BMP and CBC WNL 06/05/2015     Review of Systems: Patient complains of symptoms per HPI as  well as the following symptoms: weight loss, fatigue, blurred vision, loss of vision, eye pain, easy bruising, SOB, feeling cold, feeling hot, incontinence, spinning sensation, trouble swallowing, joint pain, cramps, aching muscles, confusion, headache, numbness, weakness, difficulty swallowing, dizziness, tremor, sleepiness, depression, anxiety, not enough sleep, racing thoughts, decreased energy. Pertinent negatives per HPI. All others negative.   Social History   Social History  . Marital Status: Married    Spouse Name: J  . Number of Children: 4  . Years of Education: 15   Occupational History  . Not on file.   Social History Main Topics  . Smoking status: Current Every Day Smoker -- 0.50 packs/day for 5 years    Types: Cigarettes  . Smokeless tobacco: Not on file     Comment: quit previously, restarted in 2007  . Alcohol Use: No  . Drug Use: No  . Sexual Activity: Yes    Birth Control/ Protection: Surgical   Other Topics Concern  . Not on file   Social History Narrative   Lives w/ mom and husband   Caffeine use: none    Family History  Problem Relation Age of Onset  . Hypertension Mother   . Hypertension Father   . Heart attack Paternal Grandfather   . Heart attack Maternal Uncle   . Stroke Paternal Grandmother   . Diabetes    . Cancer    . Multiple sclerosis Neg Hx     Past Medical History  Diagnosis Date  . Ovarian cancer ([place])   . Anxiety   . Rapid  heart rate   . Migraine   . Depression   . Stress incontinence   . SVT (supraventricular tachycardia) ([place])   . Hypertension   . Heart disease     Past Surgical History  Procedure Laterality Date  . Ovary surgery    . Cholecystectomy    . Abdominal hysterectomy    . Mouth surgery      stone  . Bladder tack    . Foot surgery      Current Outpatient Prescriptions  Medication Sig Dispense Refill  . albuterol (PROVENTIL HFA;VENTOLIN HFA) 108 (90 BASE) MCG/ACT inhaler Inhale 2 puffs into the lungs  every 4 (four) hours as needed for wheezing or shortness of breath.    Marland Kitchen aspirin 81 MG tablet Take 81 mg by mouth daily.      Marland Kitchen atenolol (TENORMIN) 25 MG tablet Take up to 25 mg in am and 25 mg in pm as needed. 60 tablet 5  . Biotin 5 MG CAPS Take 1 capsule by mouth daily.    . clonazePAM (KLONOPIN) 2 MG tablet Take 1 mg by mouth 4 (four) times daily.     . Cyanocobalamin (B-12 PO) Take 1 tablet by mouth daily. SUPER B-COMPLEX    . fludrocortisone (FLORINEF) 0.1 MG tablet Take 1 tablet (0.1 mg total) by mouth daily. 0.1 tablet 6  . meclizine (ANTIVERT) 25 MG tablet Take 25 mg by mouth 3 (three) times daily as needed for dizziness.    . midodrine (PROAMATINE) 10 MG tablet Take 1-2 tablets by mouth 3 times a day. 180 tablet 3  . mirabegron ER (MYRBETRIQ) 50 MG TB24 tablet Take 50 mg by mouth daily.    . Multiple Vitamin (MULTIVITAMIN) tablet Take 1 tablet by mouth daily. ALIVE WOMEN'S ENERGY    . Omega-3 Fatty Acids (FISH OIL) 1200 MG CAPS Take 2 capsules by mouth 2 (two) times daily.     . pantoprazole (PROTONIX) 40 MG tablet Take 40 mg by mouth 2 (two) times daily.    . pravastatin (PRAVACHOL) 20 MG tablet Take by mouth daily.      . sertraline (ZOLOFT) 25 MG tablet Take 25 mg by mouth daily. Py taking 1 tablet by mouth every morning for 2 weeks then take 2 tablets every morning    . traZODone (DESYREL) 50 MG tablet Take 50 mg by mouth as needed for sleep.     No current facility-administered medications for this visit.    Allergies as of 06/29/2015 - Review Complete 06/29/2015  Allergen Reaction Noted  . Avelox [moxifloxacin hcl in nacl] Palpitations 10/16/2011  . Iodine Swelling and Other (See Comments) 03/17/2011  . Quinolones Palpitations 06/30/2014  . Red dye Swelling 06/30/2014    Vitals: BP 111/71 mmHg  Pulse 83  Ht 5\' 2"  (1.575 m)  Wt 134 lb (60.782 kg)  BMI 24.50 kg/m2 Last Weight:  Wt Readings from Last 1 Encounters:  06/29/15 134 lb (60.782 kg)   Last Height:   Ht  Readings from Last 1 Encounters:  06/29/15 5\' 2"  (1.575 m)    Left boot on her foot.  Physical exam: Exam: Gen: NAD, conversant, well nourised, obese, well groomed                     CV: RRR, no MRG. No Carotid Bruits. No peripheral edema, warm, nontender Eyes: Conjunctivae clear without exudates or hemorrhage  Neuro: Detailed Neurologic Exam  Speech:    Speech is normal; fluent and spontaneous with  normal comprehension.  Cognition:    The patient is oriented to person, place, and time;     recent and remote memory intact;     language fluent;     normal attention, concentration,     fund of knowledge Cranial Nerves:    The pupils are equal, round, and reactive to light. The fundi are normal and spontaneous venous pulsations are present. Visual fields are full to finger confrontation.Her vision today is R 20/40, L 20/50, 20/40 bilat uncorrected.  Extraocular movements are intact. Trigeminal sensation is intact and the muscles of mastication are normal. The face is symmetric. The palate elevates in the midline. Hearing intact. Voice is normal. Shoulder shrug is normal. The tongue has normal motion without fasciculations.   Coordination:    Normal finger to nose and heel to shin. Normal rapid alternating movements.   Gait:    Heel-toe and tandem gait are normal.   Motor Observation:    No asymmetry, no atrophy, and no involuntary movements noted. Tone:    Normal muscle tone.    Posture:    Posture is normal. normal erect    Strength:    giveway throughout, cannot assess. Appears to be normal.      Sensation: intact pin prick distally i the feet. Decreased to temp slightly and vibration 3 second. Numbness right thumb and first finger.      Reflex Exam:  DTR's:    Deep tendon reflexes in the upper and lower extremities are normal bilaterally.   Toes:    The toes are downgoing bilaterally.   Clonus:    Clonus is absent.     Assessment/Plan:   55 y.o. female here as  a referral from Dr. Aron Baba for paresthesias. PMHx of Fibromyalgia(recent Dx by rheumatology), overuse of medicine, smoker, headaches, chronic pain, anxiety, migraine, depression, sinus tachycardia, chronic fatigue. She a multitude of complaints today but she was referred for  numbness and tingling in the hands or the feet, happens every day, is episodic, moves around. She also complains of episodic tremors all over, vision changes, headaches. Her neurologic exam is significant for some decreased sensation distally in the feet otherwise normal. Her vision today is R 20/40, L 20/50, 20/40 bilat uncorrected.   Patient with multiple complaints, chronic pain everywhere, she endorses a variety of symptoms including vision changes, occassional tremors, . Her neurologic exam is unremarkable.   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 [place], [place] [address]  Phone [phone] Fax [phone]

## 2015-06-29 NOTE — Patient Instructions (Signed)
Remember to drink plenty of fluid, eat healthy meals and do not skip any meals. Try to eat protein with a every meal and eat a healthy snack such as fruit or nuts in between meals. Try to keep a regular sleep-wake schedule and try to exercise daily, particularly in the form of walking, 20-30 minutes a day, if you can.   As far as diagnostic testing: MRi of the brain, EMG/NCS  I would like to see you back for emg/ncs, sooner if we need to. Please call us with any interim questions, concerns, problems, updates or refill requests.   Our phone number is [phone]. We also have an after hours call service for urgent matters and there is a physician on-call for urgent questions. For any emergencies you know to call 911 or go to the nearest emergency room

## 2015-06-30 ENCOUNTER — Other Ambulatory Visit: Payer: Self-pay | Admitting: Internal Medicine

## 2015-06-30 NOTE — Telephone Encounter (Signed)
Rx request sent to pharmacy.  

## 2015-07-01 ENCOUNTER — Telehealth: Payer: Self-pay | Admitting: Neurology

## 2015-07-01 NOTE — Telephone Encounter (Signed)
Called pt back and advised ok per Dr Jaynee Eagles to hold off. Pt states she cannot afford test at this time. She also had me cx EMG/NCS at this time. She cannot afford. She said she will call back to r/s when possible. Advised I will send message to Seth Bake to explore option of payment plan. Pt stated she was told she cannot do one of these.

## 2015-07-01 NOTE — Telephone Encounter (Signed)
Per Dr Jaynee Eagles- okay to not complete at this time. She always has option to do payment plan.

## 2015-07-01 NOTE — Telephone Encounter (Signed)
Patient called regarding CT Scan at [facility] that Dr. Jaynee Eagles ordered. Patient states that there is no way that she can pay $1,200 for this.

## 2015-07-13 ENCOUNTER — Ambulatory Visit (INDEPENDENT_AMBULATORY_CARE_PROVIDER_SITE_OTHER): Payer: BLUE CROSS/BLUE SHIELD | Admitting: Internal Medicine

## 2015-07-13 ENCOUNTER — Encounter: Payer: Self-pay | Admitting: Internal Medicine

## 2015-07-13 VITALS — BP 106/68 | HR 60 | Ht 62.0 in | Wt 130.4 lb

## 2015-07-13 DIAGNOSIS — I951 Orthostatic hypotension: Secondary | ICD-10-CM | POA: Diagnosis not present

## 2015-07-13 DIAGNOSIS — M797 Fibromyalgia: Secondary | ICD-10-CM

## 2015-07-13 DIAGNOSIS — R000 Tachycardia, unspecified: Secondary | ICD-10-CM

## 2015-07-13 DIAGNOSIS — I4711 Inappropriate sinus tachycardia, so stated: Secondary | ICD-10-CM

## 2015-07-13 NOTE — Progress Notes (Signed)
OFFICE NOTE  Chief Complaint:  Having some falls  Primary Care Physician: Corine Shelter, PA-C  HPI:  Sarah [name] is a 55 year old female patient whose husband is also my patient. She presents for the first time for evaluation of weight gain. She says that she's gained 8-10 pounds over the past 2 weeks and then 8-10 pounds prior to that. Despite this she denies eating more food, but has had no lower extremity swelling, orthopnea, PND or other heart failure symptoms. She does report shortness of breath however which is chronic and not necessarily associated with exertion. She has some chronic complaints of fatigue and actually appears to be on quite a few medications. She denies taking both clonazepam and diazepam, but they're both on her medicine list. Her dose of the clonazepam is 2 mg twice daily. She additionally takes Ambien 10 mg every night along with Remeron and Inderal 10 mg 3 times daily. The accommodation of all these medications can be extremely sedating and it appears that she is quite sedate today. She does report a history of SVT in the past and has been in the emergency room numerous times for this. She also tells me that she is planning to see a thyroid specialist and may have some issue with her thyroid. The reason for a number of her anti-anxiety medications is long standing anxiety and it does seem that this is associated with her palpitations. She denies any cardiac sounding chest pain.  Sarah [name] had an echocardiogram which showed a preserved EF of 55-60% and no evidence for heart failure. Her weight has remained the same and her swelling has improved. She seems to be tolerating atenolol better than her nonselective beta blocker. Also by decreasing her clonazepam and some of her other sedating medications she is remarkably more awake. She is still complaining of fatigue. She says she feels like she can sleep all the time. She reports intolerance to hot weather.  Finally, she  says she has breakthrough palpitations in the afternoon and has been using an extra half tablet of atenolol in the afternoon.  She was recently seen in emergency department for upper abdominal pain and it was determined that this was noncardiac. She continues to have similar symptoms that she is presented with in the past including fatigue. Blood pressure is noted to be low normal today at 101/75. She did bring in blood pressure readings from home though that show her blood pressures are in the 80s and sometimes even the [id number] and the systolic. She does feel dizzy with this.  Sarah [name] is again seen in emergency department minutes at [facility]. She had some loss of power, near-syncope and leg wraps. She felt like she couldn't breathe. In the interim she's been evaluated by rheumatologist and diagnosed with fibromyalgia. She continues to have episodes of inappropriate tachycardia. I also suspect she is having orthostatic hypotension causing her symptomatic episodes of dizziness and near syncope.  I saw Sarah [name] back in the office today. Since her last visit I placed her on Midrin and she's had some improvement in her blood pressures. At times she seemed blood pressures at high as 112 over 80s. She is only had one episode of inappropriate tachycardia requiring a full dose atenolol. She has felt a little more fatigue which may be related to starting gabapentin. She also feels a little nauseated. It may be beneficial to move that dose to the evening, however I defer to her prior care provider.  Ms.  Peed again returns for follow-up. In the interim she was seen in the flex clinic by Richardson Dopp, PA-C. After discussion with the doctor of the day at [facility], was recommended that she start on Florinef for ongoing hypotension. This is in addition to her Midrin which she is taking 3 times a day. She brought me a list of blood pressures which she had been taking every hour for several days. This demonstrates  consistently low blood pressure between 80-100. It does not seem that the Florinef is yet made any difference in her blood pressure, although she's been on it only for 2 weeks. She continues to feel fatigued and have problems with depression and anxiety. She's recently, off of one of her medications and her primary care provider is again trying to adjust her medicines to treat her fibromyalgia. The good news is that her heart rate is generally been well controlled.  I saw Sarah [name] back in the office today. She's very pleased with how she is feeling. She recently went on a trip to [place] and has done very well with her blood pressures. She's currently taking midodrine 10-30 mg daily. She also takes fludrocortisone 0.1 mg daily. This seems to be helping with her orthostatic hypotension. Blood pressure is been very stable and heart rate remained stable on low-dose atenolol 12.5 mg which she occasionally takes an additional dose in the evening.  Sarah [name] returns today for follow-up. In general seems like her current medications are done a good job at maintaining her blood pressure. She adjust them according to what her pressure is doing. Unfortunately she's been having some falls, clumsiness and what sounds a possible episodes of hemiballismus. She is scheduled to see a neurologist for workup of possible multiple sclerosis. In addition she is scheduled for brain MRI and nerve conduction studies.   PMHx:  Past Medical History  Diagnosis Date  . Ovarian cancer ([place])   . Anxiety   . Rapid heart rate   . Migraine   . Depression   . Stress incontinence   . SVT (supraventricular tachycardia) ([place])   . Hypertension   . Heart disease     Past Surgical History  Procedure Laterality Date  . Ovary surgery    . Cholecystectomy    . Abdominal hysterectomy    . Mouth surgery      stone  . Bladder tack    . Foot surgery      FAMHx:  Family History  Problem Relation Age of Onset  . Hypertension Mother    . Hypertension Father   . Heart attack Paternal Grandfather   . Heart attack Maternal Uncle   . Stroke Paternal Grandmother   . Diabetes    . Cancer    . Multiple sclerosis Neg Hx     SOCHx:   reports that she has been smoking Cigarettes.  She has a 2.5 pack-year smoking history. She does not have any smokeless tobacco history on file. She reports that she does not drink alcohol or use illicit drugs.  ALLERGIES:  Allergies  Allergen Reactions  . Avelox [Moxifloxacin Hcl In Nacl] Palpitations  . Iodine Swelling and Other (See Comments)    Sensation of internal heat  . Quinolones Palpitations  . Red Dye Swelling    ROS: A comprehensive review of systems was negative except for: Constitutional: positive for fatigue Musculoskeletal: positive for Weakness/falls  HOME MEDS: Current Outpatient Prescriptions  Medication Sig Dispense Refill  . aspirin 81 MG tablet Take 81  mg by mouth daily.      Marland Kitchen atenolol (TENORMIN) 25 MG tablet TAKE UP TO 25MG  (ONE TABLET) BY MOUTH TWICE DAILY AS NEEDED 60 tablet 0  . BEVESPI 9-4.8 MCG/ACT AERO Inhale 1 puff into the lungs daily. Take 1 puff daily  11  . Biotin 5 MG CAPS Take 1 capsule by mouth daily.    . clonazePAM (KLONOPIN) 2 MG tablet Take 1 mg by mouth 4 (four) times daily.     . Cyanocobalamin (B-12 PO) Take 1 tablet by mouth daily. SUPER B-COMPLEX    . esomeprazole (NEXIUM) 40 MG capsule Take 1 capsule by mouth 2 (two) times daily. Take 1 cap bid  4  . fludrocortisone (FLORINEF) 0.1 MG tablet Take 1 tablet (0.1 mg total) by mouth daily. 0.1 tablet 6  . meclizine (ANTIVERT) 25 MG tablet Take 25 mg by mouth 3 (three) times daily as needed for dizziness.    . midodrine (PROAMATINE) 10 MG tablet Take 1-2 tablets by mouth 3 times a day. 180 tablet 3  . mirabegron ER (MYRBETRIQ) 50 MG TB24 tablet Take 50 mg by mouth daily.    . Multiple Vitamin (MULTIVITAMIN) tablet Take 1 tablet by mouth daily. ALIVE WOMEN'S ENERGY    . Omega-3 Fatty Acids  (FISH OIL) 1200 MG CAPS Take 2 capsules by mouth 2 (two) times daily.     . pravastatin (PRAVACHOL) 20 MG tablet Take by mouth daily.      . sertraline (ZOLOFT) 25 MG tablet Take 25 mg by mouth daily. Py taking 1 tablet by mouth every morning for 2 weeks then take 2 tablets every morning    . traZODone (DESYREL) 50 MG tablet Take 50 mg by mouth as needed for sleep.     No current facility-administered medications for this visit.    LABS/IMAGING: No results found for this or any previous visit (from the past 48 hour(s)). No results found.  VITALS: BP 106/68 mmHg  Pulse 60  Ht 5\' 2"  (1.575 m)  Wt 130 lb 7 oz (59.166 kg)  BMI 23.85 kg/m2  EXAM: GEN: Awake, NAD HEENT: PERRLA, EOMI Lungs: Clear bilaterally Cardiovascular: RRR, NL S1-S2, no M/R/G's Abdomen: Soft, nontender Extremity: No edema Neurologic: Follows commands, no focal findings Psychiatric: Depressed mood, flat affect  EKG: Deferred  ASSESSMENT: 1. 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 Dyspnea on exertion - EF 55-60% 3. Stable weight 4. Dyslipidemia 5. Fibromyalgia 6. Orthostatic hypotension - stable on midodrine and florinef  PLAN: 1.   Sarah [name] has had better control of her blood pressure and heart rate with her current regimen of medicines. There is still hope to find a unifying diagnosis. One possibility could be multiple sclerosis. She's been referred to see Dr. Brett Fairy for further evaluation, head MRI and nerve conduction studies. Hopefully this is not her diagnosis. Plan follow-up in 6 months.  Pixie Casino, MD, [facility] Attending Cardiologist [place] 07/13/2015, 3:07 PM

## 2015-07-13 NOTE — Patient Instructions (Signed)
Dr Hilty recommends that you schedule a follow-up appointment in 6 months. You will receive a reminder letter in the mail two months in advance. If you don't receive a letter, please call our office to schedule the follow-up appointment.  If you need a refill on your cardiac medications before your next appointment, please call your pharmacy. 

## 2015-07-20 ENCOUNTER — Telehealth: Payer: Self-pay | Admitting: Internal Medicine

## 2015-07-20 NOTE — Telephone Encounter (Signed)
New message    Pt wants her medication mididrine 10mg  2x day can you increase to 20mg  so pt can just take 1 pill

## 2015-07-20 NOTE — Telephone Encounter (Signed)
Left pt message to call. Need to see how she is taking medication (if different than reported).

## 2015-07-20 NOTE — Telephone Encounter (Signed)
Pt states for past month she has been taking 2 tablets three times daily, consistently - used to do 1-2 tabs but 1 tablet at a time was not sufficient to address her hypotensive symptoms.  I explained that tablets only come in 2.5mg , 5mg , 10mg  strength - cannot write for a 20mg  tab. Pt voiced understanding- she was trying to cut down on number of pills she needed to take.  Advised to call if refill needed or if she notes any new concerns. Will route to Dr. Debara Pickett for any concerns related to patient having to more consistently increase the midodrine dose.

## 2015-07-21 NOTE — Telephone Encounter (Signed)
She is managing adjusting doses on her own .Marland Kitchen No concerns.  Dr. Lemmie Evens

## 2015-07-26 ENCOUNTER — Encounter: Payer: BLUE CROSS/BLUE SHIELD | Admitting: Neurology

## 2015-07-29 ENCOUNTER — Ambulatory Visit (INDEPENDENT_AMBULATORY_CARE_PROVIDER_SITE_OTHER): Payer: Self-pay

## 2015-07-29 DIAGNOSIS — R51 Headache: Principal | ICD-10-CM

## 2015-07-29 DIAGNOSIS — H539 Unspecified visual disturbance: Secondary | ICD-10-CM

## 2015-07-29 DIAGNOSIS — R202 Paresthesia of skin: Secondary | ICD-10-CM

## 2015-07-29 DIAGNOSIS — R200 Anesthesia of skin: Secondary | ICD-10-CM

## 2015-07-29 DIAGNOSIS — Z0289 Encounter for other administrative examinations: Secondary | ICD-10-CM

## 2015-07-29 DIAGNOSIS — R519 Headache, unspecified: Secondary | ICD-10-CM

## 2015-07-29 DIAGNOSIS — W19XXXA Unspecified fall, initial encounter: Secondary | ICD-10-CM

## 2015-08-02 ENCOUNTER — Telehealth: Payer: Self-pay | Admitting: *Deleted

## 2015-08-02 NOTE — Telephone Encounter (Signed)
-----   Message from Melvenia Beam, MD sent at 07/31/2015 12:33 PM EST ----- MRI of the brain normal for age. thanks

## 2015-08-02 NOTE — Telephone Encounter (Signed)
LVM for pt to call about results. Gave [facility] phone number. Ok to inform pt MRI brain normal for age per Dr Jaynee Eagles, thank you.

## 2015-08-02 NOTE — Telephone Encounter (Signed)
Patient returned your call and was advised MRI brain normal.

## 2015-09-14 ENCOUNTER — Other Ambulatory Visit: Payer: Self-pay | Admitting: Internal Medicine

## 2015-09-14 NOTE — Telephone Encounter (Signed)
New Message   *STAT* If patient is at the pharmacy, call can be transferred to refill team.   1. Which medications need to be refilled? (please list name of each medication and dose if known) atenolol (TENORMIN) 25 MG tablet  2. Which pharmacy/location (including street and city if local pharmacy) is medication to be sent to? [facility] in [place]   3. Do they need a 30 day or 90 day supply? 90 day supply

## 2015-09-14 NOTE — Telephone Encounter (Signed)
REFILL 

## 2015-09-20 ENCOUNTER — Telehealth: Payer: Self-pay | Admitting: Internal Medicine

## 2015-09-20 DIAGNOSIS — I951 Orthostatic hypotension: Secondary | ICD-10-CM

## 2015-09-20 NOTE — Telephone Encounter (Signed)
New Message  Pt c/o medication issue: 1. Name of Medication: midodrine (PROAMATINE) 10 MG tablet   4. What is your medication issue? It supposed to increase her BP. She is now taking 6 a day and it is not working. Please call back to discuss.

## 2015-09-20 NOTE — Telephone Encounter (Signed)
Pt has now been taking her midodrine  2 tablets TID for past 3 weeks. She notes "top number is never over 100". She typically has readings of 70-80/50-55. She's confirmed readings w home BP cuff and primary care office.  Notes fluid intake good - "I drink all day long". Asked for specificity and she estimates 6-8 glasses of fluid daily. Inquired about dizziness, faintness, syncope, other symptoms.  Pt states only symptom is that she doesn't "feel right". Notes she sometimes gets hot sensation, then chills, then feels normal. She draws parallel to symptoms she had w/ early menopause post-hysterectomy in 1990s.  Pt aware I will defer to Dr. Debara Pickett for his recommendations.

## 2015-09-21 MED ORDER — FLUDROCORTISONE ACETATE 0.1 MG PO TABS: 0.2000 mg | ORAL_TABLET | Freq: Every day | ORAL | Status: DC

## 2015-09-21 NOTE — Telephone Encounter (Signed)
Follow up   ° ° °Patient returning call back to triage nurse  °

## 2015-09-21 NOTE — Telephone Encounter (Signed)
Left msg for pt to call. 

## 2015-09-21 NOTE — Telephone Encounter (Signed)
Follow Up  Pt Follow up. Pt states that she has not received a follow up call. Request a call back please.

## 2015-09-21 NOTE — Telephone Encounter (Signed)
Returned call, instructions given, sent Rx to pharmacy for new dose. Pt aware, & will call w/ update in ~1 week.

## 2015-09-21 NOTE — Telephone Encounter (Signed)
She cannot take any more midodrine - that is already a maximum dose. Would advise increasing florinef to 0.2 mg (2 tablets) daily.  This may take several days to a week to take effect.  Dr. Lemmie Evens

## 2015-09-21 NOTE — Telephone Encounter (Signed)
Would review with Dr Debara Pickett for further recommendations Sarah [name]

## 2015-09-21 NOTE — Telephone Encounter (Signed)
Left msg to make caller aware this is awaiting Dr. Lysbeth Penner review.

## 2015-09-21 NOTE — Telephone Encounter (Signed)
Will forward to Dr Crenshaw DOD for review  

## 2015-10-04 ENCOUNTER — Telehealth: Payer: Self-pay | Admitting: Internal Medicine

## 2015-10-04 NOTE — Telephone Encounter (Signed)
New Message  Pt requested to speak w/ RN about her Atenolol- stated that during the day her HR will increase between her taking atenolol 2x day. Please call back and discuss.

## 2015-10-04 NOTE — Telephone Encounter (Signed)
Returned call to patient.  She notes that since going on midodrine, she has had easier time in getting her BPs into normal range. Notes last reading was 138/87. Typically ranging [id number] systolic. By comparison she was having a hard time getting systolic above [id number] ~2 weeks ago.  Also states that she has noticed since going on the midodrine, her atenolol doesn't seem to be as effective. She takes it early AM and again in evening. Notes that her HR is ~80 closer to dosing time, but in middle of day HR is in low-mid-100s.  Asking for recommendation. I informed pt I would route to Dr. Debara Pickett for recommendation, though at this point she may benefit from a med mgmt appt w/ pharmacy. She will go w/ Dr. Lysbeth Penner advice on this.

## 2015-10-04 NOTE — Telephone Encounter (Signed)
I would not worry as much about the HR - blood pressure is the most important.  Dr. Lemmie Evens

## 2015-10-04 NOTE — Telephone Encounter (Signed)
Pt voiced understanding. I suggested she do a week's worth of readings w/ journal to confirm BPs/HRs OK. If still concerns at that point, offered to set up for pharmD med mgmt visit. Pt agreeable to this.

## 2015-10-13 ENCOUNTER — Telehealth: Payer: Self-pay | Admitting: Internal Medicine

## 2015-10-13 NOTE — Telephone Encounter (Signed)
Returned call to pt. She states that he BP's have been running great, averaging 117-130/70-85. Pt is concerned with her heartrate. It is anywhere between 70 to 120. She says it seems to go up over 105-120 in the evenings when its time for her next dose of medication. When it does she does feel shaky and gets a headache.  After she takes her evening dose of atenolol is goes back down. She was told in previous telephone encounter that she may need appt to see PharmD. Appt made for pt to see PharmD in BP clinic.   Will route to Dr Debara Pickett for further advice if needed.  Routed to [employer].

## 2015-10-13 NOTE — Telephone Encounter (Signed)
I believe we have discussed this before. There will always be variability in HR and blood pressure. As long as she is not passing out from low blood pressure, there is little more we can do to regulate the HR. It is not possible to keep it in a narrow range with her condition.  Please reassure her that she need not worry about it.  Dr. Lemmie Evens

## 2015-10-13 NOTE — Telephone Encounter (Signed)
New message    Pt is calling to speak to Sarah [name] she states that he was going to schedule her to speak to someone to go over her medications

## 2015-10-14 ENCOUNTER — Ambulatory Visit (INDEPENDENT_AMBULATORY_CARE_PROVIDER_SITE_OTHER): Payer: BLUE CROSS/BLUE SHIELD | Admitting: Pharmacist Clinician (PhC)/ Clinical Pharmacy Specialist

## 2015-10-14 ENCOUNTER — Encounter: Payer: Self-pay | Admitting: Pharmacist Clinician (PhC)/ Clinical Pharmacy Specialist

## 2015-10-14 VITALS — BP 118/80 | HR 76 | Ht 62.0 in | Wt 132.0 lb

## 2015-10-14 DIAGNOSIS — I951 Orthostatic hypotension: Secondary | ICD-10-CM | POA: Diagnosis not present

## 2015-10-14 MED ORDER — METOPROLOL TARTRATE 25 MG PO TABS: 12.5000 mg | ORAL_TABLET | Freq: Two times a day (BID) | ORAL | Status: DC

## 2015-10-14 NOTE — Telephone Encounter (Signed)
No answer. Left message reassuring pt not to worry about her HR but to call us back if she has any questions. Pt to see Erasmo Downer today.

## 2015-10-14 NOTE — Progress Notes (Signed)
10/14/2015 [place] [birth year] [id number]   HPI:  Sarah [name] is a 55 y.o. female patient of Dr Debara Pickett, with a PMH below who presents today for hypertension clinic evaluation. She has had problems with hypotension in the past and is currently taking fludrocortisone 0.1 mg bid and midodrine 10 mg prn diastolic BP < 60.  She states she takes the midodrine a few times each week.  Her bigger concern is a fluctuating heart rate.  She checks it multiple times each day, and if at or above 120 she should take atenolol 25 mg, no more than twice daily.  She apparently checks her HR and BP 10 or more times per day.  She states that she has to take the atenolol twice daily every day (am and around 4pm), and admits to taking an occasional third tablet at bedtime.    Cardiac Hx: SVT, hypotension  Social Hx: smokes 1 ppd; no alcohol or caffeine  Diet: does add salt to some foods; has been told this is okay  Exercise: none due to fibromyalgia  Home BP readings: 117-130/70-85  HR varied from 77-125  Current antihypertensive medications: atenolol 25 mg bid; fludrocortisone 0.1 mg bid, midodrine 10 mg prn   Wt Readings from Last 3 Encounters:  10/14/15 132 lb (59.875 kg)  07/13/15 130 lb 7 oz (59.166 kg)  06/29/15 134 lb (60.782 kg)   BP Readings from Last 3 Encounters:  10/14/15 118/80  07/13/15 106/68  06/29/15 111/71   Pulse Readings from Last 3 Encounters:  10/14/15 76  07/13/15 60  06/29/15 83    Current Outpatient Prescriptions  Medication Sig Dispense Refill  . aspirin 81 MG tablet Take 81 mg by mouth daily.      Marland Kitchen atenolol (TENORMIN) 25 MG tablet TAKE ONE TABLET BY MOUTH TWICE DAILY AS NEEDED 60 tablet 6  . BEVESPI 9-4.8 MCG/ACT AERO Inhale 1 puff into the lungs daily. Take 1 puff daily  11  . Biotin 5 MG CAPS Take 1 capsule by mouth daily.    . clonazePAM (KLONOPIN) 2 MG tablet Take 1 mg by mouth 4 (four) times daily.     . Cyanocobalamin (B-12 PO) Take 1 tablet by mouth  daily. SUPER B-COMPLEX    . esomeprazole (NEXIUM) 40 MG capsule Take 1 capsule by mouth 2 (two) times daily. Take 1 cap bid  4  . fludrocortisone (FLORINEF) 0.1 MG tablet Take 2 tablets (0.2 mg total) by mouth daily. 60 tablet 6  . HYDROcodone-acetaminophen (NORCO/VICODIN) 5-325 MG tablet TAKE 1/2 TO 1 TABLET BY MOUTH EVERY 6 HOURS AS NEEDED FOR PAIN  0  . lamoTRIgine (LAMICTAL) 150 MG tablet Take 150 mg by mouth at bedtime.  0  . meclizine (ANTIVERT) 25 MG tablet Take 25 mg by mouth 3 (three) times daily as needed for dizziness.    . midodrine (PROAMATINE) 10 MG tablet TAKE 1 OR 2 TABLETS BY MOUTH THREE TIMES DAILY 180 tablet 6  . mirabegron ER (MYRBETRIQ) 50 MG TB24 tablet Take 50 mg by mouth daily.    . Multiple Vitamin (MULTIVITAMIN) tablet Take 1 tablet by mouth daily. ALIVE WOMEN'S ENERGY    . Omega-3 Fatty Acids (FISH OIL) 1200 MG CAPS Take 2 capsules by mouth 2 (two) times daily.     . pravastatin (PRAVACHOL) 20 MG tablet Take by mouth daily.      . sertraline (ZOLOFT) 100 MG tablet Take 150 mg by mouth daily.  0  . SYMBICORT  160-4.5 MCG/ACT inhaler Inhale 2 puffs into the lungs 2 (two) times daily.  11  . traZODone (DESYREL) 100 MG tablet TAKE 2 TO 3 TABLETS BY MOUTH AT BEDTIME AS NEEDED FOR SLEEP  0  . traZODone (DESYREL) 50 MG tablet Take 50 mg by mouth as needed for sleep.     No current facility-administered medications for this visit.    Allergies  Allergen Reactions  . Avelox [Moxifloxacin Hcl In Nacl] Palpitations  . Iodine Swelling and Other (See Comments)    Sensation of internal heat  . Quinolones Palpitations  . Red Dye Swelling    Past Medical History  Diagnosis Date  . Ovarian cancer ([place])   . Anxiety   . Rapid heart rate   . Migraine   . Depression   . Stress incontinence   . SVT (supraventricular tachycardia) ([place])   . Hypertension   . Heart disease     Blood pressure 118/80, pulse 76, height 5\' 2"  (1.575 m), weight 132 lb (59.875 kg).    Tommy Medal PharmD CPP [facility]

## 2015-10-14 NOTE — Assessment & Plan Note (Signed)
Today her home BP readings look to be quite stable.  She is more concerned about the heart rate and states that it is always high unless she takes the atenolol.  She has even taken an extra dose several times because she states that it does not work long enough.  Will switch her to metoprolol succ 25 mg - 1/2 tablet twice daily.  Explained in detail that her heart rate is not in a dangerous place, that there will always be fluctuations.  But maybe the longer-acting metoprolol will keep her numbers from bouncing up and down quite as much.  I asked her to call in 7-10 days to let us know how her HR and BP readings look.

## 2015-10-14 NOTE — Patient Instructions (Signed)
Return for a a follow up appointment in 1 month  Your blood pressure today is 118/80  Check your blood pressure at home daily and keep record of the readings.  Take your BP meds as follows: switch atenolol to metoprolol succinate 25 mg tablets, take 1/2 tablet twice daily.  Call in a week to let us know how you are doing with heart rates.    Bring all of your meds, your BP cuff and your record of home blood pressures to your next appointment.  Exercise as you're able, try to walk approximately 30 minutes per day.  Keep salt intake to a minimum, especially watch canned and prepared boxed foods.  Eat more fresh fruits and vegetables and fewer canned items.  Avoid eating in fast food restaurants.    HOW TO TAKE YOUR BLOOD PRESSURE: . Rest 5 minutes before taking your blood pressure. .  Don't smoke or drink caffeinated beverages for at least 30 minutes before. . Take your blood pressure before (not after) you eat. . Sit comfortably with your back supported and both feet on the floor (don't cross your legs). . Elevate your arm to heart level on a table or a desk. . Use the proper sized cuff. It should fit smoothly and snugly around your bare upper arm. There should be enough room to slip a fingertip under the cuff. The bottom edge of the cuff should be 1 inch above the crease of the elbow. . Ideally, take 3 measurements at one sitting and record the average.

## 2015-10-18 ENCOUNTER — Telehealth: Payer: Self-pay | Admitting: Pharmacist Clinician (PhC)/ Clinical Pharmacy Specialist

## 2015-10-18 MED ORDER — ATENOLOL 25 MG PO TABS: ORAL_TABLET | ORAL | Status: DC

## 2015-10-18 NOTE — Telephone Encounter (Signed)
Returned patient call, metoprolol controlling her heart rate about the same, but has lowered her BP significantly.  Has had readings as low as 70/40 per patient.  She states that she has tried multiple beta blockers, but they all lower her BP too much - she is on fludrocortisone and midodrine to keep BP up.   Will have her switch back to atenolol 25 mg bid, with allowance for extra dose 1-2 times per week for elevated heart rate.  Patient voiced understanding

## 2016-01-04 ENCOUNTER — Other Ambulatory Visit: Payer: Self-pay | Admitting: Internal Medicine

## 2016-01-05 NOTE — Telephone Encounter (Signed)
Metoprolol refill refused - patient takes atenolol

## 2016-01-12 ENCOUNTER — Ambulatory Visit (INDEPENDENT_AMBULATORY_CARE_PROVIDER_SITE_OTHER): Payer: BLUE CROSS/BLUE SHIELD | Admitting: Internal Medicine

## 2016-01-12 ENCOUNTER — Encounter (INDEPENDENT_AMBULATORY_CARE_PROVIDER_SITE_OTHER): Payer: Self-pay

## 2016-01-12 ENCOUNTER — Encounter: Payer: Self-pay | Admitting: Internal Medicine

## 2016-01-12 VITALS — BP 133/83 | HR 69 | Ht 62.0 in | Wt 126.2 lb

## 2016-01-12 DIAGNOSIS — R000 Tachycardia, unspecified: Secondary | ICD-10-CM | POA: Diagnosis not present

## 2016-01-12 DIAGNOSIS — I10 Essential (primary) hypertension: Secondary | ICD-10-CM | POA: Diagnosis not present

## 2016-01-12 DIAGNOSIS — R0602 Shortness of breath: Secondary | ICD-10-CM

## 2016-01-12 DIAGNOSIS — R1031 Right lower quadrant pain: Secondary | ICD-10-CM | POA: Insufficient documentation

## 2016-01-12 DIAGNOSIS — R5383 Other fatigue: Secondary | ICD-10-CM | POA: Insufficient documentation

## 2016-01-12 DIAGNOSIS — R0989 Other specified symptoms and signs involving the circulatory and respiratory systems: Secondary | ICD-10-CM

## 2016-01-12 DIAGNOSIS — R1032 Left lower quadrant pain: Secondary | ICD-10-CM

## 2016-01-12 DIAGNOSIS — F419 Anxiety disorder, unspecified: Secondary | ICD-10-CM

## 2016-01-12 DIAGNOSIS — R1030 Lower abdominal pain, unspecified: Secondary | ICD-10-CM

## 2016-01-12 NOTE — Progress Notes (Signed)
OFFICE NOTE  Chief Complaint:  Weight loss, fatigue, labile blood pressures, dyspnea, bilateral groin pain  Primary Care Physician: Beatris Si  HPI:  Sarah [name] is a 55 year old female patient whose husband is also my patient. She presents for the first time for evaluation of weight gain. She says that she's gained 8-10 pounds over the past 2 weeks and then 8-10 pounds prior to that. Despite this she denies eating more food, but has had no lower extremity swelling, orthopnea, PND or other heart failure symptoms. She does report shortness of breath however which is chronic and not necessarily associated with exertion. She has some chronic complaints of fatigue and actually appears to be on quite a few medications. She denies taking both clonazepam and diazepam, but they're both on her medicine list. Her dose of the clonazepam is 2 mg twice daily. She additionally takes Ambien 10 mg every night along with Remeron and Inderal 10 mg 3 times daily. The accommodation of all these medications can be extremely sedating and it appears that she is quite sedate today. She does report a history of SVT in the past and has been in the emergency room numerous times for this. She also tells me that she is planning to see a thyroid specialist and may have some issue with her thyroid. The reason for a number of her anti-anxiety medications is long standing anxiety and it does seem that this is associated with her palpitations. She denies any cardiac sounding chest pain.  Sarah [name] had an echocardiogram which showed a preserved EF of 55-60% and no evidence for heart failure. Her weight has remained the same and her swelling has improved. She seems to be tolerating atenolol better than her nonselective beta blocker. Also by decreasing her clonazepam and some of her other sedating medications she is remarkably more awake. She is still complaining of fatigue. She says she feels like she can sleep all the  time. She reports intolerance to hot weather.  Finally, she says she has breakthrough palpitations in the afternoon and has been using an extra half tablet of atenolol in the afternoon.  She was recently seen in emergency department for upper abdominal pain and it was determined that this was noncardiac. She continues to have similar symptoms that she is presented with in the past including fatigue. Blood pressure is noted to be low normal today at 101/75. She did bring in blood pressure readings from home though that show her blood pressures are in the 80s and sometimes even the [id number] and the systolic. She does feel dizzy with this.  Sarah [name] is again seen in emergency department minutes at [facility]. She had some loss of power, near-syncope and leg wraps. She felt like she couldn't breathe. In the interim she's been evaluated by rheumatologist and diagnosed with fibromyalgia. She continues to have episodes of inappropriate tachycardia. I also suspect she is having orthostatic hypotension causing her symptomatic episodes of dizziness and near syncope.  I saw Sarah [name] back in the office today. Since her last visit I placed her on Midrin and she's had some improvement in her blood pressures. At times she seemed blood pressures at high as 112 over 80s. She is only had one episode of inappropriate tachycardia requiring a full dose atenolol. She has felt a little more fatigue which may be related to starting gabapentin. She also feels a little nauseated. It may be beneficial to move that dose to the evening, however I defer  to her prior care provider.  Sarah [name] again returns for follow-up. In the interim she was seen in the flex clinic by Richardson Dopp, PA-C. After discussion with the doctor of the day at [facility], was recommended that she start on Florinef for ongoing hypotension. This is in addition to her Midrin which she is taking 3 times a day. She brought me a list of blood pressures which she had been  taking every hour for several days. This demonstrates consistently low blood pressure between 80-100. It does not seem that the Florinef is yet made any difference in her blood pressure, although she's been on it only for 2 weeks. She continues to feel fatigued and have problems with depression and anxiety. She's recently, off of one of her medications and her primary care provider is again trying to adjust her medicines to treat her fibromyalgia. The good news is that her heart rate is generally been well controlled.  I saw Sarah [name] back in the office today. She's very pleased with how she is feeling. She recently went on a trip to [place] and has done very well with her blood pressures. She's currently taking midodrine 10-30 mg daily. She also takes fludrocortisone 0.1 mg daily. This seems to be helping with her orthostatic hypotension. Blood pressure is been very stable and heart rate remained stable on low-dose atenolol 12.5 mg which she occasionally takes an additional dose in the evening.  Sarah [name] returns today for follow-up. In general seems like her current medications are done a good job at maintaining her blood pressure. She adjust them according to what her pressure is doing. Unfortunately she's been having some falls, clumsiness and what sounds a possible episodes of hemiballismus. She is scheduled to see a neurologist for workup of possible multiple sclerosis. In addition she is scheduled for brain MRI and nerve conduction studies.   01/12/2016  Sarah [name] seen back today in the office for follow-up. In the interim she saw a neurologist to perform an MRI and nerve conduction studies. There was no evidence of objective findings to explain her symptoms. She's also been seeing a psychiatrist without any clear need for treatment. She was diagnosed with fibromyalgia. She is complaining of pain in both groins today which is been excruciating. No one can seem to find an etiology of this. Blood pressure  remains labile. She is called the office numerous times and is notably checking her blood pressure at one point up to 10 times a day. She seems to adjust her Florinef and Midrin based on blood pressure readings. She much less seldomly notes a low blood pressure and notes that when her blood pressure gets above 160 she starts to have excruciating headache.  PMHx:  Past Medical History:  Diagnosis Date  . Anxiety   . Depression   . Heart disease   . Hypertension   . Migraine   . Ovarian cancer ([place])   . Rapid heart rate   . Stress incontinence   . SVT (supraventricular tachycardia) ([facility])     Past Surgical History:  Procedure Laterality Date  . ABDOMINAL HYSTERECTOMY    . bladder tack    . CHOLECYSTECTOMY    . FOOT SURGERY    . MOUTH SURGERY     stone  . OVARY SURGERY      FAMHx:  Family History  Problem Relation Age of Onset  . Hypertension Mother   . Hypertension Father   . Heart attack Paternal Grandfather   . Heart  attack Maternal Uncle   . Stroke Paternal Grandmother   . Diabetes    . Cancer    . Multiple sclerosis Neg Hx     SOCHx:   reports that she has been smoking Cigarettes.  She has a 2.50 pack-year smoking history. She does not have any smokeless tobacco history on file. She reports that she does not drink alcohol or use drugs.  ALLERGIES:  Allergies  Allergen Reactions  . Avelox [Moxifloxacin Hcl In Nacl] Palpitations  . Iodine Swelling and Other (See Comments)    Sensation of internal heat  . Quinolones Palpitations  . Red Dye Swelling    ROS: Pertinent items noted in HPI and remainder of comprehensive ROS otherwise negative.  HOME MEDS: Current Outpatient Prescriptions  Medication Sig Dispense Refill  . aspirin 81 MG tablet Take 81 mg by mouth daily.      Marland Kitchen atenolol (TENORMIN) 25 MG tablet Take 1 tablet by mouth twice daily, may take extra tablet 1-2 times per week for elevated heart rate 210 tablet 1  . Biotin 5 MG CAPS Take 1 capsule by mouth  daily.    . budesonide-formoterol (SYMBICORT) 160-4.5 MCG/ACT inhaler Inhale 2 puffs into the lungs 2 (two) times daily.    . clonazePAM (KLONOPIN) 2 MG tablet Take 1 mg by mouth 4 (four) times daily.     . Cyanocobalamin (B-12 PO) Take 1 tablet by mouth daily. SUPER B-COMPLEX    . esomeprazole (NEXIUM) 40 MG capsule Take 1 capsule by mouth 2 (two) times daily. Take 1 cap bid  4  . fludrocortisone (FLORINEF) 0.1 MG tablet Take 2 tablets (0.2 mg total) by mouth daily. 60 tablet 6  . HYDROcodone-acetaminophen (NORCO/VICODIN) 5-325 MG tablet TAKE 1/2 TO 1 TABLET BY MOUTH EVERY 6 HOURS AS NEEDED FOR PAIN  0  . lamoTRIgine (LAMICTAL) 150 MG tablet Take 150 mg by mouth at bedtime.  0  . meclizine (ANTIVERT) 25 MG tablet Take 25 mg by mouth 3 (three) times daily as needed for dizziness.    . midodrine (PROAMATINE) 10 MG tablet TAKE 1 OR 2 TABLETS BY MOUTH THREE TIMES DAILY 180 tablet 6  . mirabegron ER (MYRBETRIQ) 50 MG TB24 tablet Take 50 mg by mouth daily.    . Multiple Vitamin (MULTIVITAMIN) tablet Take 1 tablet by mouth daily. ALIVE WOMEN'S ENERGY    . Omega-3 Fatty Acids (FISH OIL) 1200 MG CAPS Take 2 capsules by mouth 2 (two) times daily.     . pravastatin (PRAVACHOL) 20 MG tablet Take by mouth daily.      . sertraline (ZOLOFT) 100 MG tablet Take 150 mg by mouth daily.  0  . traZODone (DESYREL) 100 MG tablet take 1 tablet by mouth daily as needed for sleep  0   No current facility-administered medications for this visit.     LABS/IMAGING: No results found for this or any previous visit (from the past 48 hour(s)). No results found.  VITALS: BP 133/83   Pulse 69   Ht 5\' 2"  (1.575 m)   Wt 126 lb 3.2 oz (57.2 kg)   BMI 23.08 kg/m   EXAM: GEN: Awake, NAD HEENT: PERRLA, EOMI Lungs: Clear bilaterally Cardiovascular: RRR, NL S1-S2, no M/R/G's Abdomen: Soft, nontender Extremity: No edema, no pain on palpation over the legs./groin Neurologic: Follows commands, no focal  findings Psychiatric: Depressed mood, flat affect Vascular: Decreased p  EKG: Normal sinus rhythm at 69, nonspecific ST changes  ASSESSMENT: 1. 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 Dyspnea on exertion - EF 55-60% 3. Excessive weight loss 4. Dyslipidemia 5. Fibromyalgia 6. Orthostatic hypotension - stable on midodrine and florinef 7. Dyspnea 8. Bilateral groin pain  PLAN: 1.   Mrs. Veillon seems to continue to be declining. She's had excessive weight loss, now down to 126 pounds from 130 pounds. She's not eating very much. She has significant fatigue, she is in chronic pain. She reports bilateral groin pain without any clear causes. She's also had labile blood pressures. I like to check abdominal and renal Dopplers as well as peripheral arterial Dopplers today to rule out any obstruction. She is reporting dyspnea on exertion and has had a low normal EF of 50-55% in the past. I will also recheck an echocardiogram. She's advised not to check her blood pressures multiple times during the day and should not significantly altered the doses of her blood pressure medicines.  Pixie Casino, MD, [facility] Attending Cardiologist [place] 01/12/2016, 5:05 PM

## 2016-01-12 NOTE — Patient Instructions (Signed)
Dr. Debara Pickett has ordered: renal doppler, aorta/IVC/iliac artery dopplers, lower extremity arterial dopplers. These tests are all done in our office.   Your physician has requested that you have an echocardiogram @ [address] - 3rd floor. Echocardiography is a painless test that uses sound waves to create images of your heart. It provides your doctor with information about the size and shape of your heart and how well your heart's chambers and valves are working. This procedure takes approximately one hour. There are no restrictions for this procedure.  Your physician recommends that you schedule a follow-up appointment after your testing.

## 2016-01-13 ENCOUNTER — Other Ambulatory Visit: Payer: Self-pay | Admitting: Internal Medicine

## 2016-01-13 DIAGNOSIS — R1031 Right lower quadrant pain: Secondary | ICD-10-CM

## 2016-01-13 DIAGNOSIS — R1032 Left lower quadrant pain: Principal | ICD-10-CM

## 2016-01-21 ENCOUNTER — Encounter (INDEPENDENT_AMBULATORY_CARE_PROVIDER_SITE_OTHER): Payer: Self-pay

## 2016-01-21 ENCOUNTER — Ambulatory Visit (HOSPITAL_COMMUNITY): Payer: BLUE CROSS/BLUE SHIELD | Attending: Cardiology

## 2016-01-21 ENCOUNTER — Other Ambulatory Visit: Payer: Self-pay

## 2016-01-21 DIAGNOSIS — I10 Essential (primary) hypertension: Secondary | ICD-10-CM | POA: Insufficient documentation

## 2016-01-21 DIAGNOSIS — Z720 Tobacco use: Secondary | ICD-10-CM | POA: Insufficient documentation

## 2016-01-21 DIAGNOSIS — R0602 Shortness of breath: Secondary | ICD-10-CM | POA: Insufficient documentation

## 2016-01-21 DIAGNOSIS — Z8249 Family history of ischemic heart disease and other diseases of the circulatory system: Secondary | ICD-10-CM | POA: Insufficient documentation

## 2016-01-21 DIAGNOSIS — R0600 Dyspnea, unspecified: Secondary | ICD-10-CM | POA: Diagnosis present

## 2016-01-24 ENCOUNTER — Ambulatory Visit (HOSPITAL_COMMUNITY)
Admission: RE | Admit: 2016-01-24 | Discharge: 2016-01-24 | Disposition: A | Discharge status: Home / Self Care | Payer: BLUE CROSS/BLUE SHIELD | Source: Ambulatory Visit | Attending: Cardiology | Admitting: Cardiology

## 2016-01-24 ENCOUNTER — Encounter (HOSPITAL_COMMUNITY): Payer: Self-pay

## 2016-01-24 DIAGNOSIS — I708 Atherosclerosis of other arteries: Secondary | ICD-10-CM | POA: Insufficient documentation

## 2016-01-24 DIAGNOSIS — F419 Anxiety disorder, unspecified: Secondary | ICD-10-CM | POA: Insufficient documentation

## 2016-01-24 DIAGNOSIS — N281 Cyst of kidney, acquired: Secondary | ICD-10-CM | POA: Insufficient documentation

## 2016-01-24 DIAGNOSIS — R1030 Lower abdominal pain, unspecified: Secondary | ICD-10-CM | POA: Insufficient documentation

## 2016-01-24 DIAGNOSIS — F329 Major depressive disorder, single episode, unspecified: Secondary | ICD-10-CM | POA: Diagnosis not present

## 2016-01-24 DIAGNOSIS — R1032 Left lower quadrant pain: Secondary | ICD-10-CM

## 2016-01-24 DIAGNOSIS — I10 Essential (primary) hypertension: Secondary | ICD-10-CM

## 2016-01-24 DIAGNOSIS — R1031 Right lower quadrant pain: Secondary | ICD-10-CM

## 2016-01-24 DIAGNOSIS — I700 Atherosclerosis of aorta: Secondary | ICD-10-CM | POA: Insufficient documentation

## 2016-01-24 DIAGNOSIS — R0989 Other specified symptoms and signs involving the circulatory and respiratory systems: Secondary | ICD-10-CM

## 2016-01-25 ENCOUNTER — Telehealth: Payer: Self-pay | Admitting: Internal Medicine

## 2016-01-25 NOTE — Telephone Encounter (Signed)
°  New Prob    Pt is requesting to speak to nurse regarding most recent imaging results. Please call.

## 2016-01-25 NOTE — Telephone Encounter (Signed)
Returned call to patient- made aware of results.  Notes Recorded by Pixie Casino, MD on 01/25/2016 at 12:26 PM EDT Aortic atherosclerosis, but no narrowing to explain pain.  Dr. Lemmie Evens   Notes Recorded by Pixie Casino, MD on 01/25/2016 at 12:27 PM EDT Normal LE arterial dopplers. No follow-up necessary.  Dr. Lemmie Evens   Aware Renal US is not complete and read at this time.  Verbalized understanding.

## 2016-01-25 NOTE — Telephone Encounter (Signed)
Patient would like test results from yesterday

## 2016-01-25 NOTE — Telephone Encounter (Signed)
Returned patient call-went over test results with patient again as well as Renal US results.  Pt verbalized understanding and is aware of f/u appt on 9/19 with MD Hilty.  Advised to call with further concerns or questions.

## 2016-01-27 ENCOUNTER — Telehealth: Payer: Self-pay | Admitting: Internal Medicine

## 2016-01-27 NOTE — Telephone Encounter (Signed)
Returned patient call-made aware of pharmacist review.  Pt verbalized understanding.

## 2016-01-27 NOTE — Telephone Encounter (Signed)
No problems with interactions with her cardiac medications

## 2016-01-27 NOTE — Telephone Encounter (Signed)
Pt other doctor yesterday put her on Wellbutrin XL 150 mg . She wants to know if this will interfere with her heart and blood pressure medicine?

## 2016-02-22 ENCOUNTER — Ambulatory Visit (INDEPENDENT_AMBULATORY_CARE_PROVIDER_SITE_OTHER): Payer: BLUE CROSS/BLUE SHIELD | Admitting: Internal Medicine

## 2016-02-22 ENCOUNTER — Encounter: Payer: Self-pay | Admitting: Internal Medicine

## 2016-02-22 VITALS — BP 110/72 | HR 74 | Ht 62.0 in | Wt 123.0 lb

## 2016-02-22 DIAGNOSIS — R1030 Lower abdominal pain, unspecified: Secondary | ICD-10-CM

## 2016-02-22 DIAGNOSIS — R0989 Other specified symptoms and signs involving the circulatory and respiratory systems: Secondary | ICD-10-CM

## 2016-02-22 DIAGNOSIS — R000 Tachycardia, unspecified: Secondary | ICD-10-CM

## 2016-02-22 DIAGNOSIS — R1032 Left lower quadrant pain: Principal | ICD-10-CM

## 2016-02-22 DIAGNOSIS — I10 Essential (primary) hypertension: Secondary | ICD-10-CM | POA: Diagnosis not present

## 2016-02-22 DIAGNOSIS — I951 Orthostatic hypotension: Secondary | ICD-10-CM | POA: Diagnosis not present

## 2016-02-22 DIAGNOSIS — R1031 Right lower quadrant pain: Secondary | ICD-10-CM

## 2016-02-22 DIAGNOSIS — M797 Fibromyalgia: Secondary | ICD-10-CM | POA: Diagnosis not present

## 2016-02-22 NOTE — Patient Instructions (Signed)
Medication Instructions:  Your physician recommends that you continue on your current medications as directed. Please refer to the Current Medication list given to you today.  Labwork: NONE ORDERED  Testing/Procedures: NONE ORDERED  Follow-Up: Your physician wants you to follow-up in: 6 MONTHS WITH DR HILTY. You will receive a reminder letter in the mail two months in advance. If you don't receive a letter, please call our office to schedule the follow-up appointment.  Any Other Special Instructions Will Be Listed Below (If Applicable).     If you need a refill on your cardiac medications before your next appointment, please call your pharmacy.

## 2016-02-22 NOTE — Progress Notes (Signed)
OFFICE NOTE  Chief Complaint:  Follow-up, weight loss, fatigue, labile blood pressures, dyspnea, bilateral groin pain  Primary Care Physician: Sarah [name]  HPI:  Sarah [name] is a 55 year old female patient whose husband is also my patient. She presents for the first time for evaluation of weight gain. She says that she's gained 8-10 pounds over the past 2 weeks and then 8-10 pounds prior to that. Despite this she denies eating more food, but has had no lower extremity swelling, orthopnea, PND or other heart failure symptoms. She does report shortness of breath however which is chronic and not necessarily associated with exertion. She has some chronic complaints of fatigue and actually appears to be on quite a few medications. She denies taking both clonazepam and diazepam, but they're both on her medicine list. Her dose of the clonazepam is 2 mg twice daily. She additionally takes Ambien 10 mg every night along with Remeron and Inderal 10 mg 3 times daily. The accommodation of all these medications can be extremely sedating and it appears that she is quite sedate today. She does report a history of SVT in the past and has been in the emergency room numerous times for this. She also tells me that she is planning to see a thyroid specialist and may have some issue with her thyroid. The reason for a number of her anti-anxiety medications is long standing anxiety and it does seem that this is associated with her palpitations. She denies any cardiac sounding chest pain.  Sarah [name] had an echocardiogram which showed a preserved EF of 55-60% and no evidence for heart failure. Her weight has remained the same and her swelling has improved. She seems to be tolerating atenolol better than her nonselective beta blocker. Also by decreasing her clonazepam and some of her other sedating medications she is remarkably more awake. She is still complaining of fatigue. She says she feels like she can sleep  all the time. She reports intolerance to hot weather.  Finally, she says she has breakthrough palpitations in the afternoon and has been using an extra half tablet of atenolol in the afternoon.  She was recently seen in emergency department for upper abdominal pain and it was determined that this was noncardiac. She continues to have similar symptoms that she is presented with in the past including fatigue. Blood pressure is noted to be low normal today at 101/75. She did bring in blood pressure readings from home though that show her blood pressures are in the 80s and sometimes even the [id number] and the systolic. She does feel dizzy with this.  Sarah [name] is again seen in emergency department minutes at [facility]. She had some loss of power, near-syncope and leg wraps. She felt like she couldn't breathe. In the interim she's been evaluated by rheumatologist and diagnosed with fibromyalgia. She continues to have episodes of inappropriate tachycardia. I also suspect she is having orthostatic hypotension causing her symptomatic episodes of dizziness and near syncope.  I saw Sarah [name] back in the office today. Since her last visit I placed her on Midrin and she's had some improvement in her blood pressures. At times she seemed blood pressures at high as 112 over 80s. She is only had one episode of inappropriate tachycardia requiring a full dose atenolol. She has felt a little more fatigue which may be related to starting gabapentin. She also feels a little nauseated. It may be beneficial to move that dose to the evening, however I  defer to her prior care provider.  Sarah [name] again returns for follow-up. In the interim she was seen in the flex clinic by Sarah Dopp, PA-C. After discussion with the doctor of the day at [facility], was recommended that she start on Florinef for ongoing hypotension. This is in addition to her Midrin which she is taking 3 times a day. She brought me a list of blood pressures which she  had been taking every hour for several days. This demonstrates consistently low blood pressure between 80-100. It does not seem that the Florinef is yet made any difference in her blood pressure, although she's been on it only for 2 weeks. She continues to feel fatigued and have problems with depression and anxiety. She's recently, off of one of her medications and her primary care provider is again trying to adjust her medicines to treat her fibromyalgia. The good news is that her heart rate is generally been well controlled.  I saw Sarah [name] back in the office today. She's very pleased with how she is feeling. She recently went on a trip to [place] and has done very well with her blood pressures. She's currently taking midodrine 10-30 mg daily. She also takes fludrocortisone 0.1 mg daily. This seems to be helping with her orthostatic hypotension. Blood pressure is been very stable and heart rate remained stable on low-dose atenolol 12.5 mg which she occasionally takes an additional dose in the evening.  Sarah [name] returns today for follow-up. In general seems like her current medications are done a good job at maintaining her blood pressure. She adjust them according to what her pressure is doing. Unfortunately she's been having some falls, clumsiness and what sounds a possible episodes of hemiballismus. She is scheduled to see a neurologist for workup of possible multiple sclerosis. In addition she is scheduled for brain MRI and nerve conduction studies.   01/12/2016  Sarah [name] seen back today in the office for follow-up. In the interim she saw a neurologist to perform an MRI and nerve conduction studies. There was no evidence of objective findings to explain her symptoms. She's also been seeing a psychiatrist without any clear need for treatment. She was diagnosed with fibromyalgia. She is complaining of pain in both groins today which is been excruciating. No one can seem to find an etiology of this. Blood  pressure remains labile. She is called the office numerous times and is notably checking her blood pressure at one point up to 10 times a day. She seems to adjust her Florinef and Midrin based on blood pressure readings. She much less seldomly notes a low blood pressure and notes that when her blood pressure gets above 160 she starts to have excruciating headache.  02/22/2016  Sarah [name] was seen today in follow-up. She underwent lower extremity arterial Dopplers, renal Dopplers, abdominal aortic Doppler and echocardiogram. These demonstrated no significant findings other than mild atherosclerosis of the abdominal aorta. LVEF is normal on echo. There is no explanation for her groin pain which is likely fibromyalgia.  PMHx:  Past Medical History:  Diagnosis Date  . Anxiety   . Depression   . Heart disease   . Hypertension   . Migraine   . Ovarian cancer ([place])   . Rapid heart rate   . Stress incontinence   . SVT (supraventricular tachycardia) ([facility])     Past Surgical History:  Procedure Laterality Date  . ABDOMINAL HYSTERECTOMY    . bladder tack    . CHOLECYSTECTOMY    .  FOOT SURGERY    . MOUTH SURGERY     stone  . OVARY SURGERY      FAMHx:  Family History  Problem Relation Age of Onset  . Hypertension Mother   . Hypertension Father   . Heart attack Paternal Grandfather   . Heart attack Maternal Uncle   . Stroke Paternal Grandmother   . Diabetes    . Cancer    . Multiple sclerosis Neg Hx     SOCHx:   reports that she has been smoking Cigarettes.  She has a 2.50 pack-year smoking history. She has never used smokeless tobacco. She reports that she does not drink alcohol or use drugs.  ALLERGIES:  Allergies  Allergen Reactions  . Avelox [Moxifloxacin Hcl In Nacl] Palpitations  . Iodine Swelling and Other (See Comments)    Sensation of internal heat  . Quinolones Palpitations  . Red Dye Swelling    ROS: Pertinent items noted in HPI and remainder of comprehensive ROS  otherwise negative.  HOME MEDS: Current Outpatient Prescriptions  Medication Sig Dispense Refill  . aspirin 81 MG tablet Take 81 mg by mouth daily.      Marland Kitchen atenolol (TENORMIN) 25 MG tablet Take 1 tablet by mouth twice daily, may take extra tablet 1-2 times per week for elevated heart rate 210 tablet 1  . Biotin 5 MG CAPS Take 1 capsule by mouth daily.    . budesonide-formoterol (SYMBICORT) 160-4.5 MCG/ACT inhaler Inhale 2 puffs into the lungs 2 (two) times daily.    Marland Kitchen buPROPion (WELLBUTRIN XL) 150 MG 24 hr tablet Take 150 mg by mouth daily.    . clonazePAM (KLONOPIN) 2 MG tablet Take 1 mg by mouth 4 (four) times daily.     . Cyanocobalamin (B-12 PO) Take 1 tablet by mouth daily. SUPER B-COMPLEX    . esomeprazole (NEXIUM) 40 MG capsule Take 1 capsule by mouth 2 (two) times daily. Take 1 cap bid  4  . fludrocortisone (FLORINEF) 0.1 MG tablet Take 2 tablets (0.2 mg total) by mouth daily. 60 tablet 6  . HYDROcodone-acetaminophen (NORCO/VICODIN) 5-325 MG tablet TAKE 1/2 TO 1 TABLET BY MOUTH EVERY 6 HOURS AS NEEDED FOR PAIN  0  . lamoTRIgine (LAMICTAL) 150 MG tablet Take 150 mg by mouth at bedtime.  0  . meclizine (ANTIVERT) 25 MG tablet Take 25 mg by mouth 3 (three) times daily as needed for dizziness.    . midodrine (PROAMATINE) 10 MG tablet TAKE 1 OR 2 TABLETS BY MOUTH THREE TIMES DAILY 180 tablet 6  . mirabegron ER (MYRBETRIQ) 50 MG TB24 tablet Take 50 mg by mouth daily.    . Multiple Vitamin (MULTIVITAMIN) tablet Take 1 tablet by mouth daily. ALIVE WOMEN'S ENERGY    . Omega-3 Fatty Acids (FISH OIL) 1200 MG CAPS Take 2 capsules by mouth 2 (two) times daily.     . pravastatin (PRAVACHOL) 20 MG tablet Take by mouth daily.      . sertraline (ZOLOFT) 100 MG tablet Take 150 mg by mouth daily.  0  . traZODone (DESYREL) 100 MG tablet take 1 tablet by mouth daily as needed for sleep  0   No current facility-administered medications for this visit.     LABS/IMAGING: No results found for this or any  previous visit (from the past 48 hour(s)). No results found.  VITALS: BP 110/72   Pulse 74   Ht 5\' 2"  (1.575 m)   Wt 123 lb (55.8 kg)   BMI 22.50 kg/m  EXAM: Deferred  EKG: Deferred  ASSESSMENT: 1. 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 Dyspnea on exertion - EF 55-60% 3. Excessive weight loss 4. Dyslipidemia 5. Fibromyalgia 6. Orthostatic hypotension - stable on midodrine and florinef 7. Dyspnea 8. Bilateral groin pain - likely fibromyalgia  PLAN: 1.   Mrs. Stonehocker had reassuring peripheral Dopplers, without a suggestion of her bilateral groin pain. Likely this is related to fibromyalgia. Blood pressure appears to be more appropriately controlled on Midodrine and Florinef. Overall she is doing fairly well although needs to work on keeping up with her weight loss. Follow-up in 6 months.  Pixie Casino, MD, [facility] Attending Cardiologist Realitos C Hilty 02/22/2016, 5:46 PM

## 2016-03-17 ENCOUNTER — Telehealth: Payer: Self-pay | Admitting: Internal Medicine

## 2016-03-17 DIAGNOSIS — I951 Orthostatic hypotension: Secondary | ICD-10-CM

## 2016-03-17 MED ORDER — METOPROLOL TARTRATE 25 MG PO TABS: ORAL_TABLET | ORAL | 3 refills | Status: DC

## 2016-03-17 NOTE — Telephone Encounter (Signed)
Switch to metoprolol tartrate with same sig: Take 1 tablet by mouth twice daily, may take extra tablet 1-2 times per week for elevated heart rate

## 2016-03-17 NOTE — Telephone Encounter (Signed)
Called patient and advised on medication switch. She is aware to call if she has further needs or does not find metoprolol to be effective w her symptoms. She voiced understanding of instructions and was thankful for call.

## 2016-03-17 NOTE — Telephone Encounter (Signed)
Re: atenolol Patient cannot get this medication at pharmacy due to shortage. Please advise on alternative.

## 2016-03-17 NOTE — Telephone Encounter (Signed)
PT is calling about having the medication ATENOLOL 25mg  replaced today.  She is out and the pharm. Has discontinued this medication.   Please give her a call back.

## 2016-03-27 ENCOUNTER — Institutional Professional Consult (permissible substitution): Payer: BLUE CROSS/BLUE SHIELD | Admitting: Emergency Medicine

## 2016-03-28 ENCOUNTER — Telehealth: Payer: Self-pay | Admitting: Internal Medicine

## 2016-03-28 NOTE — Telephone Encounter (Signed)
SPOKE TO PATIENT  CHANGE FROM ATENOLOL PATIENT STATES SINCE SHE WAS SWITCH TO METOPROLOL TART. -- HEART RATE AND BLOOD PRESSURE FLUCTUATE HIGH AND LOW  RANGE B/P 82/53 - 154 /97, PULSE 80--108 PATIENT HAS TRIED METOPROLOL SUCCINATE WITHOUT SUCCESS  WILL DEFER TO BLOOD PRESSURE CLINIC PHARMACIST AND CONTACT

## 2016-03-28 NOTE — Telephone Encounter (Signed)
New Message  Pt c/o medication issue:  1. Name of Medication: metoprolol tartrate  2. How are you currently taking this medication (dosage and times per day)? 25 mg tablet take 1 or two tablets by mouth three times daily  3. Are you having a reaction (difficulty breathing--STAT)? No  4. What is your medication issue? Pt voiced having a hard time keeping HR and BP where it's suppose to be while taking this medication.  Please f/u with pt

## 2016-03-29 MED ORDER — ATENOLOL 25 MG PO TABS: 25.0000 mg | ORAL_TABLET | Freq: Every day | ORAL | 3 refills | Status: DC

## 2016-03-29 NOTE — Telephone Encounter (Signed)
Spoke to patient who reports that she is currently in [place] near her reportedly has atentolol in stock. She requests RX to be sent to this pharmacy. She will change back to atenolol as this has controlled her symptoms in the past. She knows to call with any further issues.

## 2016-03-30 ENCOUNTER — Telehealth: Payer: Self-pay | Admitting: Internal Medicine

## 2016-03-30 MED ORDER — ATENOLOL 25 MG PO TABS: ORAL_TABLET | ORAL | 3 refills | Status: DC

## 2016-03-30 NOTE — Telephone Encounter (Signed)
Please give pt a call concerning her atenolol (TENORMIN) 25 MG tablet [id number] the instructions are not correct on the new bottle she just got from her pharmacy

## 2016-03-30 NOTE — Telephone Encounter (Signed)
Spoke with pt, she reports being able to find atenolol but the script was wrong. She reports taking atenolol 25 mg twice daily with an extra 1 tablet as needed. New script sent to the pharmacy

## 2016-04-23 ENCOUNTER — Telehealth: Payer: Self-pay | Admitting: Physician Assistant

## 2016-04-23 NOTE — Telephone Encounter (Signed)
Thanks Nicki Reaper - I agree. Not much we can advise since she is in [place] other than for her to seek care there if needed.  -Mali

## 2016-04-23 NOTE — Telephone Encounter (Signed)
Sarah [name] is a 55 y.o. female with a hx of inappropriate sinus tachycardia and orthostatic hypotension. She is in [place] with her daughter. She called the answering service b/c, when she awoke this AM, she "did not feel right."  She noted abdominal cramping and frequent belching.  She felt weak and was diaphoretic. Her BP has been high - 143/102; 152/104 - HR 80s. It is never high like this. She denies chest pain, shortness of breath, syncope, fevers, chills, cough, nausea, vomiting, diarrhea, bleeding. She just finished antibiotics for a UTI. She just took an Atenolol. PLAN: I suspect she may have some viral illness. I asked her to monitor her BP over the next 1-2 hours. If it does not come down or if she continues to feel poorly, she should go to an urgent care or ED. She agrees with this plan. Richardson Dopp, PA-C   04/23/2016 11:32 AM

## 2016-05-10 ENCOUNTER — Institutional Professional Consult (permissible substitution): Payer: BLUE CROSS/BLUE SHIELD | Admitting: Pulmonary Disease

## 2016-08-02 ENCOUNTER — Other Ambulatory Visit: Payer: Self-pay | Admitting: Internal Medicine

## 2016-08-02 ENCOUNTER — Telehealth: Payer: Self-pay | Admitting: Internal Medicine

## 2016-08-02 NOTE — Telephone Encounter (Signed)
Error

## 2016-08-22 ENCOUNTER — Ambulatory Visit (INDEPENDENT_AMBULATORY_CARE_PROVIDER_SITE_OTHER): Payer: BLUE CROSS/BLUE SHIELD | Admitting: Internal Medicine

## 2016-08-22 ENCOUNTER — Encounter: Payer: Self-pay | Admitting: Internal Medicine

## 2016-08-22 VITALS — BP 118/76 | HR 76 | Ht 62.0 in | Wt 120.0 lb

## 2016-08-22 DIAGNOSIS — R000 Tachycardia, unspecified: Secondary | ICD-10-CM | POA: Diagnosis not present

## 2016-08-22 DIAGNOSIS — I951 Orthostatic hypotension: Secondary | ICD-10-CM | POA: Diagnosis not present

## 2016-08-22 MED ORDER — MIDODRINE HCL 10 MG PO TABS: ORAL_TABLET | ORAL | 3 refills | Status: AC

## 2016-08-22 MED ORDER — FLUDROCORTISONE ACETATE 0.1 MG PO TABS: 200.0000 ug | ORAL_TABLET | Freq: Every day | ORAL | 3 refills | Status: AC

## 2016-08-22 MED ORDER — ATENOLOL 25 MG PO TABS: ORAL_TABLET | ORAL | 3 refills | Status: AC

## 2016-08-22 NOTE — Progress Notes (Signed)
OFFICE NOTE  Chief Complaint:  Occasional tachycardia  Primary Care Physician: Sarah [name]  HPI:  Sarah [name] is a 56 year old female patient whose husband is also my patient. She presents for the first time for evaluation of weight gain. She says that she's gained 8-10 pounds over the past 2 weeks and then 8-10 pounds prior to that. Despite this she denies eating more food, but has had no lower extremity swelling, orthopnea, PND or other heart failure symptoms. She does report shortness of breath however which is chronic and not necessarily associated with exertion. She has some chronic complaints of fatigue and actually appears to be on quite a few medications. She denies taking both clonazepam and diazepam, but they're both on her medicine list. Her dose of the clonazepam is 2 mg twice daily. She additionally takes Ambien 10 mg every night along with Remeron and Inderal 10 mg 3 times daily. The accommodation of all these medications can be extremely sedating and it appears that she is quite sedate today. She does report a history of SVT in the past and has been in the emergency room numerous times for this. She also tells me that she is planning to see a thyroid specialist and may have some issue with her thyroid. The reason for a number of her anti-anxiety medications is long standing anxiety and it does seem that this is associated with her palpitations. She denies any cardiac sounding chest pain.  Sarah [name] had an echocardiogram which showed a preserved EF of 55-60% and no evidence for heart failure. Her weight has remained the same and her swelling has improved. She seems to be tolerating atenolol better than her nonselective beta blocker. Also by decreasing her clonazepam and some of her other sedating medications she is remarkably more awake. She is still complaining of fatigue. She says she feels like she can sleep all the time. She reports intolerance to hot weather.  Finally,  she says she has breakthrough palpitations in the afternoon and has been using an extra half tablet of atenolol in the afternoon.  She was recently seen in emergency department for upper abdominal pain and it was determined that this was noncardiac. She continues to have similar symptoms that she is presented with in the past including fatigue. Blood pressure is noted to be low normal today at 101/75. She did bring in blood pressure readings from home though that show her blood pressures are in the 80s and sometimes even the 27X and the systolic. She does feel dizzy with this.  Sarah [name] is again seen in emergency department minutes at [facility]. She had some loss of power, near-syncope and leg wraps. She felt like she couldn't breathe. In the interim she's been evaluated by rheumatologist and diagnosed with fibromyalgia. She continues to have episodes of inappropriate tachycardia. I also suspect she is having orthostatic hypotension causing her symptomatic episodes of dizziness and near syncope.  I saw Sarah [name] back in the office today. Since her last visit I placed her on Midrin and she's had some improvement in her blood pressures. At times she seemed blood pressures at high as 112 over 80s. She is only had one episode of inappropriate tachycardia requiring a full dose atenolol. She has felt a little more fatigue which may be related to starting gabapentin. She also feels a little nauseated. It may be beneficial to move that dose to the evening, however I defer to her prior care provider.  Sarah [name]  again returns for follow-up. In the interim she was seen in the flex clinic by Sarah Dopp, PA-C. After discussion with the doctor of the day at [facility], was recommended that she start on Florinef for ongoing hypotension. This is in addition to her Midrin which she is taking 3 times a day. She brought me a list of blood pressures which she had been taking every hour for several days. This demonstrates  consistently low blood pressure between 80-100. It does not seem that the Florinef is yet made any difference in her blood pressure, although she's been on it only for 2 weeks. She continues to feel fatigued and have problems with depression and anxiety. She's recently, off of one of her medications and her primary care provider is again trying to adjust her medicines to treat her fibromyalgia. The good news is that her heart rate is generally been well controlled.  I saw Sarah [name] back in the office today. She's very pleased with how she is feeling. She recently went on a trip to [place] and has done very well with her blood pressures. She's currently taking midodrine 10-30 mg daily. She also takes fludrocortisone 0.1 mg daily. This seems to be helping with her orthostatic hypotension. Blood pressure is been very stable and heart rate remained stable on low-dose atenolol 12.5 mg which she occasionally takes an additional dose in the evening.  Sarah [name] returns today for follow-up. In general seems like her current medications are done a good job at maintaining her blood pressure. She adjust them according to what her pressure is doing. Unfortunately she's been having some falls, clumsiness and what sounds a possible episodes of hemiballismus. She is scheduled to see a neurologist for workup of possible multiple sclerosis. In addition she is scheduled for brain MRI and nerve conduction studies.   01/12/2016  Sarah [name] seen back today in the office for follow-up. In the interim she saw a neurologist to perform an MRI and nerve conduction studies. There was no evidence of objective findings to explain her symptoms. She's also been seeing a psychiatrist without any clear need for treatment. She was diagnosed with fibromyalgia. She is complaining of pain in both groins today which is been excruciating. No one can seem to find an etiology of this. Blood pressure remains labile. She is called the office numerous times and  is notably checking her blood pressure at one point up to 10 times a day. She seems to adjust her Florinef and Midrin based on blood pressure readings. She much less seldomly notes a low blood pressure and notes that when her blood pressure gets above 160 she starts to have excruciating headache.  02/22/2016  Sarah [name] was seen today in follow-up. She underwent lower extremity arterial Dopplers, renal Dopplers, abdominal aortic Doppler and echocardiogram. These demonstrated no significant findings other than mild atherosclerosis of the abdominal aorta. LVEF is normal on echo. There is no explanation for her groin pain which is likely fibromyalgia.  08/22/2016  Sarah [name] returns today for follow-up. Blood pressure is improved and stable on her current regimen. She does have some occasional breakthrough palpitations and tachycardia for which she takes extra atenolol. This seems to be working fairly well for her. She's had no syncopal episodes.  PMHx:  Past Medical History:  Diagnosis Date  . Anxiety   . Depression   . Heart disease   . Hypertension   . Migraine   . Ovarian cancer ([place])   . Rapid heart rate   .  Stress incontinence   . SVT (supraventricular tachycardia) (H[facility])     Past Surgical History:  Procedure Laterality Date  . ABDOMINAL HYSTERECTOMY    . bladder tack    . CHOLECYSTECTOMY    . FOOT SURGERY    . MOUTH SURGERY     stone  . OVARY SURGERY      FAMHx:  Family History  Problem Relation Age of Onset  . Hypertension Mother   . Hypertension Father   . Heart attack Paternal Grandfather   . Heart attack Maternal Uncle   . Stroke Paternal Grandmother   . Diabetes    . Cancer    . Multiple sclerosis Neg Hx     SOCHx:   reports that she has been smoking Cigarettes.  She has a 2.50 pack-year smoking history. She has never used smokeless tobacco. She reports that she does not drink alcohol or use drugs.  ALLERGIES:  Allergies  Allergen Reactions  . Avelox [Moxifloxacin  Hcl In Nacl] Palpitations  . Iodine Swelling and Other (See Comments)    Sensation of internal heat  . Quinolones Palpitations  . Red Dye Swelling    ROS: Pertinent items noted in HPI and remainder of comprehensive ROS otherwise negative.  HOME MEDS: Current Outpatient Prescriptions  Medication Sig Dispense Refill  . aspirin 81 MG tablet Take 81 mg by mouth daily.      Marland Kitchen atenolol (TENORMIN) 25 MG tablet Take 1 tablet by mouth twice daily, may take extra tablet 1-2 times per week for elevated heart rate 75 tablet 6  . Biotin 5 MG CAPS Take 1 capsule by mouth daily.    . budesonide-formoterol (SYMBICORT) 160-4.5 MCG/ACT inhaler Inhale 2 puffs into the lungs 2 (two) times daily.    Marland Kitchen buPROPion (WELLBUTRIN XL) 150 MG 24 hr tablet Take 150 mg by mouth daily.    . clonazePAM (KLONOPIN) 2 MG tablet Take 1 mg by mouth 4 (four) times daily.     . Cyanocobalamin (B-12 PO) Take 1 tablet by mouth daily. SUPER B-COMPLEX    . esomeprazole (NEXIUM) 40 MG capsule Take 1 capsule by mouth 2 (two) times daily. Take 1 cap bid  4  . fludrocortisone (FLORINEF) 0.1 MG tablet TAKE TWO TABLETS BY MOUTH DAILY 180 tablet 3  . HYDROcodone-acetaminophen (NORCO/VICODIN) 5-325 MG tablet TAKE 1/2 TO 1 TABLET BY MOUTH EVERY 6 HOURS AS NEEDED FOR PAIN  0  . lamoTRIgine (LAMICTAL) 150 MG tablet Take 150 mg by mouth at bedtime.  0  . meclizine (ANTIVERT) 25 MG tablet Take 25 mg by mouth 3 (three) times daily as needed for dizziness.    . midodrine (PROAMATINE) 10 MG tablet TAKE 1 OR 2 TABLETS BY MOUTH THREE TIMES DAILY 180 tablet 6  . mirabegron ER (MYRBETRIQ) 50 MG TB24 tablet Take 50 mg by mouth daily.    . Multiple Vitamin (MULTIVITAMIN) tablet Take 1 tablet by mouth daily. ALIVE WOMEN'S ENERGY    . Omega-3 Fatty Acids (FISH OIL) 1200 MG CAPS Take 2 capsules by mouth 2 (two) times daily.     . pravastatin (PRAVACHOL) 20 MG tablet Take 30 mg by mouth daily.     . sertraline (ZOLOFT) 100 MG tablet Take 200 mg by mouth  daily.   0  . traZODone (DESYREL) 100 MG tablet take 1 tablet by mouth daily as needed for sleep  0   No current facility-administered medications for this visit.     LABS/IMAGING: No results found for this or  any previous visit (from the past 48 hour(s)). No results found.  VITALS: BP 118/76   Pulse 76   Ht 5\' 2"  (1.575 m)   Wt 120 lb (54.4 kg)   BMI 21.95 kg/m   EXAM: General appearance: alert and no distress Lungs: clear to auscultation bilaterally Heart: regular rate and rhythm Extremities: extremities normal, atraumatic, no cyanosis or edema Neurologic: Grossly normal  EKG: Normal sinus rhythm at 76  ASSESSMENT: 1. 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 Dyspnea on exertion - EF 55-60% 3. Excessive weight loss 4. Dyslipidemia 5. Fibromyalgia 6. Orthostatic hypotension - stable on midodrine and florinef 7. Dyspnea 8. Bilateral groin pain - likely fibromyalgia  PLAN: 1.   Sarah [name] has had stable blood pressures now on her combination of Midrin and Florinef. She occasionally takes atenolol for inappropriate sinus tachycardia. Generally she seems to be pretty stable. She's had no syncope. We'll continue her current medicines see her back in 6 months.  Pixie Casino, MD, [facility] Attending Cardiologist Irwin C Keeyon [place]itera 08/22/2016, 5:07 PM

## 2016-08-22 NOTE — Patient Instructions (Signed)
Your physician wants you to follow-up in: 6 months with Dr. Hilty. You will receive a reminder letter in the mail two months in advance. If you don't receive a letter, please call our office to schedule the follow-up appointment.    

## 2017-02-27 ENCOUNTER — Telehealth: Payer: Self-pay | Admitting: Internal Medicine

## 2017-02-27 NOTE — Telephone Encounter (Signed)
Returned call to patient she stated she is now living at [place].Stated for the past couple of days B/P elevated 154/101,148/100,158/96.Pulse 60 to 80.She has a bad headache. Stated she takes Atenolol 25 mg twice a day if needed.She has taken 25 mg twice a day for the past 2 days.Stated she went to ED there 2 weeks ago she felt faint B/P then 86/44.She has appointment with a new physician [place] [facility] [place] next [facility] 03/06/17.She wanted to know what she should do about elevated B/P.Stated she is afraid to take more Atenolol due to dropping pulse.Advised to go to a Urgent Care or ED there.Advised I will send message to Dr.Hilty.

## 2017-02-27 NOTE — Telephone Encounter (Signed)
Returned call to patient Dr.Hilty's recommendations given.She stated she only takes midodrine if needed and she only takes Atenolol 25 mg twice a day if needed.Stated she went to ED and was told potassium low.Stated she was advised to increase potassium in her diet.Stated she will take Atenolol 25 mg twice a day. Advised to monitor B/P and pulse.Keep appointment as planned with Dr.next week, take B/P diary to appointment.

## 2017-02-27 NOTE — Telephone Encounter (Signed)
New message      Pt c/o BP issue: STAT if pt c/o blurred vision, one-sided weakness or slurred speech  1. What are your last 5 BP readings?  148/100, 158/96 2. Are you having any other symptoms (ex. Dizziness, headache, blurred vision, passed out)? headache  3. What is your BP issue? bp is high.  Pt is concerned.  Please advise

## 2017-02-27 NOTE — Telephone Encounter (Signed)
If she is still taking midodrine 2-3x per day- she should reduce that by one tablet daily..  Dr. Lemmie Evens

## 2017-02-27 NOTE — Telephone Encounter (Signed)
Follow Up ° ° ° ° ° ° ° °Pt returning phone call °

## 2017-02-27 NOTE — Telephone Encounter (Signed)
Left message for pt to call.

## 2017-02-27 NOTE — Telephone Encounter (Signed)
Returned call to patient not at home.LMTC. 

## 2017-02-27 NOTE — Telephone Encounter (Signed)
F/u message ° °Pt returning RN call .please call back to discuss  °

## 2019-01-13 ENCOUNTER — Telehealth: Payer: Self-pay | Admitting: Internal Medicine

## 2019-01-13 NOTE — Telephone Encounter (Signed)
Recall - cell & home #'s incorrect

## 2019-07-18 ENCOUNTER — Encounter: Payer: Self-pay | Admitting: General Practice
